# Patient Record
Sex: Female | Born: 1995 | Race: White | Hispanic: No | Marital: Single | Smoking: Never smoker
Health system: Southern US, Community
[De-identification: ages and names within clinical notes are randomized; demographics above are authoritative.]

## PROBLEM LIST (undated history)

## (undated) DIAGNOSIS — K219 Gastro-esophageal reflux disease without esophagitis: Secondary | ICD-10-CM

## (undated) DIAGNOSIS — F32A Depression, unspecified: Secondary | ICD-10-CM

## (undated) DIAGNOSIS — F329 Major depressive disorder, single episode, unspecified: Secondary | ICD-10-CM

## (undated) HISTORY — PX: WISDOM TOOTH EXTRACTION: SHX21

## (undated) HISTORY — DX: Depression, unspecified: F32.A

## (undated) HISTORY — DX: Gastro-esophageal reflux disease without esophagitis: K21.9

## (undated) HISTORY — DX: Major depressive disorder, single episode, unspecified: F32.9

---
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
## 2013-12-31 ENCOUNTER — Ambulatory Visit (INDEPENDENT_AMBULATORY_CARE_PROVIDER_SITE_OTHER): Payer: BC Managed Care – PPO | Admitting: Family Medicine

## 2013-12-31 ENCOUNTER — Encounter: Payer: Self-pay | Admitting: Family Medicine

## 2013-12-31 VITALS — BP 112/71 | HR 87 | Temp 98.4°F | Ht 65.0 in | Wt 145.0 lb

## 2013-12-31 DIAGNOSIS — F418 Other specified anxiety disorders: Secondary | ICD-10-CM | POA: Diagnosis not present

## 2013-12-31 DIAGNOSIS — T63481A Toxic effect of venom of other arthropod, accidental (unintentional), initial encounter: Secondary | ICD-10-CM

## 2013-12-31 DIAGNOSIS — R05 Cough: Secondary | ICD-10-CM | POA: Diagnosis not present

## 2013-12-31 DIAGNOSIS — T63891A Toxic effect of contact with other venomous animals, accidental (unintentional), initial encounter: Secondary | ICD-10-CM

## 2013-12-31 DIAGNOSIS — R058 Other specified cough: Secondary | ICD-10-CM

## 2013-12-31 MED ORDER — BENZONATATE 100 MG PO CAPS: 100.0000 mg | ORAL_CAPSULE | Freq: Two times a day (BID) | ORAL | Status: DC | PRN

## 2013-12-31 MED ORDER — FLUOXETINE HCL 10 MG PO CAPS: ORAL_CAPSULE | ORAL | Status: DC

## 2013-12-31 NOTE — Progress Notes (Signed)
   Subjective:    Patient ID: Kelli Gill, female    DOB: 09-05-1995, 18 y.o.   MRN: [id number]  HPI  [facility] for anxiety and depression.  She had been getting into fight with her mom and Dad.  They took her to the ED for mood. She didn't want to Scype over the computer and so decided to come in here todayTo discuss and address her mood.Marland Kitchen. Has had thoughts of being better off dead but not thought os harming herself.  She feels that she is stressed with work and with home life. She still lives at home with her parents who primarily support her. She has a younger sister who does very well academically in school. She feels that they are treated very differently. She goes to school at [employer] and works 2 jobs. She works part-time at a [employer] station and part-time with [employer]. She feels like her parents oftentimes belittle her make 5 her when she tries to share her feelings and when she gets upset. She also recently broke up with her boyfriend. She's also been very fresh it was school because she's been having a lot of computerproblems. She's been there multiple times to try to get the Internet and problems corrected so that she can complete her assignments. She says to the point that she sent fresh rated she almost wants to stop school even though she is about ready to graduate.  Insect sting on her right forearm right before coming here. She says it's painful enemas feels like her right arm is going numb. She denies any itching. No fevers chills or sweats or lightheadedness.  Had a Severe cold a couple weeks ago. Overall she feels better except she is still continued to have a cough. Reverse chills or sweats.  Review of Systems     Objective:   Physical Exam  Constitutional: She is oriented to person, place, and time. She appears well-developed and well-nourished.  HENT:  Head: Normocephalic and atraumatic.  Right Ear: External ear normal.  Left Ear: External  ear normal.  Nose: Nose normal.  Mouth/Throat: Oropharynx is clear and moist.  TMs and canals are clear.   Eyes: Conjunctivae and EOM are normal. Pupils are equal, round, and reactive to light.  Neck: Neck supple. No thyromegaly present.  Cardiovascular: Normal rate, regular rhythm and normal heart sounds.   Pulmonary/Chest: Effort normal and breath sounds normal. She has no wheezes.  Lymphadenopathy:    She has no cervical adenopathy.  Neurological: She is alert and oriented to person, place, and time.  Skin: Skin is warm and dry.  Psychiatric: She has a normal mood and affect.          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  Discussed options of counseling/therapy and medication. She said she is open to both. Will place referral for therapist. Kelli LabradorWe'll also start her on fluoxetine 10 mg or 5 days and then increase to 29 g. I will see her back in one week to make sure that she's doing well. We did discuss the increased risk of suicide. Also discussed additional side effects of the medication.  Postinfectious cough-gave reassurance. We'll recheck her lungs next week as well.  Insect sting - given 800 mg of ibuprofen and 10 mg of Zyrtec here in the office. We'll also provide a cold compress for her to use her ear to help with swelling and erythema.

## 2013-12-31 NOTE — Patient Instructions (Signed)

## 2014-01-07 ENCOUNTER — Ambulatory Visit: Payer: BC Managed Care – PPO | Admitting: Family Medicine

## 2014-01-07 DIAGNOSIS — Z0289 Encounter for other administrative examinations: Secondary | ICD-10-CM

## 2014-02-07 ENCOUNTER — Ambulatory Visit: Payer: BC Managed Care – PPO | Admitting: Family Medicine

## 2014-02-19 ENCOUNTER — Other Ambulatory Visit: Payer: Self-pay | Admitting: Family Medicine

## 2014-05-03 ENCOUNTER — Other Ambulatory Visit: Payer: Self-pay | Admitting: Family Medicine

## 2014-10-06 LAB — HIV ANTIBODY

## 2014-10-20 LAB — HM PAP SMEAR: HM Pap smear: NEGATIVE

## 2014-10-28 ENCOUNTER — Ambulatory Visit (INDEPENDENT_AMBULATORY_CARE_PROVIDER_SITE_OTHER): Payer: BLUE CROSS/BLUE SHIELD | Admitting: Family Medicine

## 2014-10-28 ENCOUNTER — Encounter: Payer: Self-pay | Admitting: Family Medicine

## 2014-10-28 VITALS — BP 106/65 | HR 82 | Temp 98.4°F | Ht 65.0 in | Wt 168.0 lb

## 2014-10-28 DIAGNOSIS — J019 Acute sinusitis, unspecified: Secondary | ICD-10-CM | POA: Diagnosis not present

## 2014-10-28 MED ORDER — AMOXICILLIN 875 MG PO TABS: 875.0000 mg | ORAL_TABLET | Freq: Two times a day (BID) | ORAL | Status: DC

## 2014-10-28 NOTE — Progress Notes (Signed)
   Subjective:    Patient ID: Kelli Gill, female    DOB: [birth year], 19 y.o.   MRN: [id number]  HPI URI x2wks she has only been taking robintussin, yellow mucus, no fever she is currently pregnant EDD 05/16/2015. Cough is deep and stragling.  Talking has been triggering the cough. Has had alot of congestion.     Review of Systems     Objective:   Physical Exam  Constitutional: She is oriented to person, place, and time. She appears well-developed and well-nourished.  HENT:  Head: Normocephalic and atraumatic.  Right Ear: External ear normal.  Left Ear: External ear normal.  Nose: Nose normal.  Mouth/Throat: Oropharynx is clear and moist.  TMs and canals are clear.   Eyes: Conjunctivae and EOM are normal. Pupils are equal, round, and reactive to light.  Neck: Neck supple. No thyromegaly present.  Cardiovascular: Normal rate, regular rhythm and normal heart sounds.   Pulmonary/Chest: Effort normal and breath sounds normal. She has no wheezes.  Lymphadenopathy:    She has no cervical adenopathy.  Neurological: She is alert and oriented to person, place, and time.  Skin: Skin is warm and dry.  Psychiatric: She has a normal mood and affect.          Assessment & Plan:  Acute sinusitis- will treat with amoxicillin. She's in about her 10th week of pregnancy. Call if not better in one week. Can try maybe pot for nasal congestion as well.

## 2015-02-17 ENCOUNTER — Ambulatory Visit (INDEPENDENT_AMBULATORY_CARE_PROVIDER_SITE_OTHER): Payer: BLUE CROSS/BLUE SHIELD | Admitting: Physician Assistant

## 2015-02-17 ENCOUNTER — Encounter: Payer: Self-pay | Admitting: Physician Assistant

## 2015-02-17 VITALS — BP 107/54 | HR 87 | Temp 98.0°F | Ht 65.0 in | Wt 192.0 lb

## 2015-02-17 DIAGNOSIS — J069 Acute upper respiratory infection, unspecified: Secondary | ICD-10-CM

## 2015-02-17 MED ORDER — AZITHROMYCIN 250 MG PO TABS: ORAL_TABLET | ORAL | Status: DC

## 2015-02-17 NOTE — Progress Notes (Signed)
   Subjective:    Patient ID: Kelli Gill, female    DOB: [birth year], 19 y.o.   MRN: [id number]  HPI  Patient is a 19 year old female who is [redacted] weeks pregnant who presents to the clinic with sore throat and dry cough for the last 2 days. She woke up with a worse sore throat the right this 5 out of 10. She denies any fever, chills, body aches. She does have left ear plan. She has not tried anything to make better. Her husband also has similar symptoms. She denies any sinus pressure.   Both woke up with St. Last night and then this morning. No fever, chills, body aches. Ear pain.  Not tried anything.     Review of Systems  All other systems reviewed and are negative.      Objective:   Physical Exam  Constitutional: She is oriented to person, place, and time. She appears well-developed and well-nourished.  HENT:  Head: Normocephalic and atraumatic.  Right Ear: External ear normal.  Left Ear: External ear normal.  TMs slightly erythematous and appears to have some fluid behind TMs. No blood or pus present.  Oropharynx erythematous with no tonsillar swelling or exudate.  Bilateral nares turbinates are red and swollen.  There is no maxillary or frontal sinus tenderness to palpation.  Eyes: Conjunctivae are normal. Right eye exhibits no discharge. Left eye exhibits no discharge.  Neck: Normal range of motion. Neck supple.  Bilateral anterior cervical adenopathy that is tender to palpation.  Cardiovascular: Normal rate, regular rhythm and normal heart sounds.   Pulmonary/Chest: Effort normal and breath sounds normal.  Lymphadenopathy:    She has cervical adenopathy.  Neurological: She is alert and oriented to person, place, and time.  Psychiatric: She has a normal mood and affect. Her behavior is normal.          Assessment & Plan:  Acute upper respiratory infection-symptoms appear to be consistent with a viral infection. I did not see any signs of bacterial infection. I did  write patient out of work for today and tomorrow. Patient was given a handout of over-the-counter medication she can take for symptom control. Encouraged Robitussin-DM. Encouraged honey for sore throat and cough. I did go ahead and print off antibiotic for Z-Pak since it is a holiday weekend. She was instructed only to take this if symptoms worsen in the next 48-72 hours.

## 2015-02-17 NOTE — Patient Instructions (Signed)
Upper Respiratory Infection, Adult Most upper respiratory infections (URIs) are a viral infection of the air passages leading to the lungs. A URI affects the nose, throat, and upper air passages. The most common type of URI is nasopharyngitis and is typically referred to as "the common cold." URIs run their course and usually go away on their own. Most of the time, a URI does not require medical attention, but sometimes a bacterial infection in the upper airways can follow a viral infection. This is called a secondary infection. Sinus and middle ear infections are common types of secondary upper respiratory infections. Bacterial pneumonia can also complicate a URI. A URI can worsen asthma and chronic obstructive pulmonary disease (COPD). Sometimes, these complications can require emergency medical care and may be life threatening.  CAUSES Almost all URIs are caused by viruses. A virus is a type of germ and can spread from one person to another.  RISKS FACTORS You may be at risk for a URI if:   You smoke.   You have chronic heart or lung disease.  You have a weakened defense (immune) system.   You are very young or very old.   You have nasal allergies or asthma.  You work in crowded or poorly ventilated areas.  You work in health care facilities or schools. SIGNS AND SYMPTOMS  Symptoms typically develop 2-3 days after you come in contact with a cold virus. Most viral URIs last 7-10 days. However, viral URIs from the influenza virus (flu virus) can last 14-18 days and are typically more severe. Symptoms may include:   Runny or stuffy (congested) nose.   Sneezing.   Cough.   Sore throat.   Headache.   Fatigue.   Fever.   Loss of appetite.   Pain in your forehead, behind your eyes, and over your cheekbones (sinus pain).  Muscle aches.  DIAGNOSIS  Your health care provider may diagnose a URI by:  Physical exam.  Tests to check that your symptoms are not due to  another condition such as:  Strep throat.  Sinusitis.  Pneumonia.  Asthma. TREATMENT  A URI goes away on its own with time. It cannot be cured with medicines, but medicines may be prescribed or recommended to relieve symptoms. Medicines may help:  Reduce your fever.  Reduce your cough.  Relieve nasal congestion. HOME CARE INSTRUCTIONS   Take medicines only as directed by your health care provider.   Gargle warm saltwater or take cough drops to comfort your throat as directed by your health care provider.  Use a warm mist humidifier or inhale steam from a shower to increase air moisture. This may make it easier to breathe.  Drink enough fluid to keep your urine clear or pale yellow.   Eat soups and other clear broths and maintain good nutrition.   Rest as needed.   Return to work when your temperature has returned to normal or as your health care provider advises. You may need to stay home longer to avoid infecting others. You can also use a face mask and careful hand washing to prevent spread of the virus.  Increase the usage of your inhaler if you have asthma.   Do not use any tobacco products, including cigarettes, chewing tobacco, or electronic cigarettes. If you need help quitting, ask your health care provider. PREVENTION  The best way to protect yourself from getting a cold is to practice good hygiene.   Avoid oral or hand contact with people with cold   symptoms.   Wash your hands often if contact occurs.  There is no clear evidence that vitamin C, vitamin E, echinacea, or exercise reduces the chance of developing a cold. However, it is always recommended to get plenty of rest, exercise, and practice good nutrition.  SEEK MEDICAL CARE IF:   You are getting worse rather than better.   Your symptoms are not controlled by medicine.   You have chills.  You have worsening shortness of breath.  You have brown or red mucus.  You have yellow or brown nasal  discharge.  You have pain in your face, especially when you bend forward.  You have a fever.  You have swollen neck glands.  You have pain while swallowing.  You have white areas in the back of your throat. SEEK IMMEDIATE MEDICAL CARE IF:   You have severe or persistent:  Headache.  Ear pain.  Sinus pain.  Chest pain.  You have chronic lung disease and any of the following:  Wheezing.  Prolonged cough.  Coughing up blood.  A change in your usual mucus.  You have a stiff neck.  You have changes in your:  Vision.  Hearing.  Thinking.  Mood. MAKE SURE YOU:   Understand these instructions.  Will watch your condition.  Will get help right away if you are not doing well or get worse.   This information is not intended to replace advice given to you by your health care provider. Make sure you discuss any questions you have with your health care provider.   Document Released: 08/07/2000 Document Revised: 06/28/2014 Document Reviewed: 05/19/2013 Elsevier Interactive Patient Education 2016 [employer]  

## 2015-02-21 ENCOUNTER — Telehealth: Payer: Self-pay

## 2015-02-21 ENCOUNTER — Ambulatory Visit (INDEPENDENT_AMBULATORY_CARE_PROVIDER_SITE_OTHER): Payer: BLUE CROSS/BLUE SHIELD | Admitting: Physician Assistant

## 2015-02-21 ENCOUNTER — Encounter: Payer: Self-pay | Admitting: Physician Assistant

## 2015-02-21 VITALS — BP 106/66 | HR 108 | Ht 65.0 in | Wt 189.0 lb

## 2015-02-21 DIAGNOSIS — J069 Acute upper respiratory infection, unspecified: Secondary | ICD-10-CM | POA: Diagnosis not present

## 2015-02-21 MED ORDER — AZITHROMYCIN 250 MG PO TABS: ORAL_TABLET | ORAL | Status: DC

## 2015-02-21 NOTE — Patient Instructions (Signed)
robatussin DM.

## 2015-02-21 NOTE — Telephone Encounter (Signed)
Left message advising patient to use the rx that was printed for her on Friday and have that filled.

## 2015-02-22 NOTE — Progress Notes (Signed)
   Subjective:    Patient ID: Kelli Gill, female    DOB: October 23, 1995, 19 y.o.   MRN: [id number]  HPI Pt is a 19 yo female, [redacted] weeks pregnant, who presents to the clinic with URI symptoms that have worsened. She was seen on 02/17/15 and encouraged to try safe OTC medications. She started a nasal saline, vitamin C, zinc, and sudafed combination. Seems to be getting worse. Work made her leave today. She never started abx given on Friday to use if symptoms worsened. No fever, SOB, wheezing. She does have a cough.    Review of Systems  All other systems reviewed and are negative.      Objective:   Physical Exam  Constitutional: She is oriented to person, place, and time. She appears well-developed and well-nourished.  HENT:  Head: Normocephalic and atraumatic.  Right Ear: External ear normal.  Left Ear: External ear normal.  Mouth/Throat: No oropharyngeal exudate.  TM's dull with light reflex absent over right ear.  Tenderness over maxillary sinuses to palpation.  Bilateral nasal turbinates red and swollen.   Eyes:  Bilateral conjunctiva injected.   Neck: Normal range of motion. Neck supple.  Cardiovascular: Normal rate, regular rhythm and normal heart sounds.   Pulmonary/Chest: Effort normal and breath sounds normal. She has no wheezes.  Lymphadenopathy:    She has cervical adenopathy.  Neurological: She is alert and oriented to person, place, and time.  Psychiatric: She has a normal mood and affect. Her behavior is normal.          Assessment & Plan:  Acute upper respiratory infection- start zpak today. Written out of work for today and tomorrow. Rest and hydrate. Try robatussin DM. Follow up as needed.

## 2015-02-24 LAB — HIV ANTIBODY: HIV: NEGATIVE

## 2015-04-17 LAB — CHLAMYDIA SCREEN: Chlamydia, Swab/Urine, PCR: NEGATIVE

## 2015-04-17 LAB — GONORRHEA SCREEN: GC Probe Amp, Genital: NEGATIVE

## 2015-04-27 ENCOUNTER — Encounter: Payer: Self-pay | Admitting: Family Medicine

## 2015-05-12 ENCOUNTER — Ambulatory Visit: Payer: BC Managed Care – PPO | Admitting: Family Medicine

## 2015-06-28 ENCOUNTER — Encounter: Payer: Self-pay | Admitting: *Deleted

## 2015-06-28 ENCOUNTER — Emergency Department (INDEPENDENT_AMBULATORY_CARE_PROVIDER_SITE_OTHER)
Admission: EM | Admit: 2015-06-28 | Discharge: 2015-06-28 | Disposition: A | Discharge status: Home / Self Care | Payer: Medicaid Other | Source: Home / Self Care | Attending: Family Medicine | Admitting: Family Medicine

## 2015-06-28 DIAGNOSIS — L03115 Cellulitis of right lower limb: Secondary | ICD-10-CM | POA: Diagnosis not present

## 2015-06-28 MED ORDER — DOXYCYCLINE HYCLATE 100 MG PO CAPS
100.0000 mg | ORAL_CAPSULE | Freq: Two times a day (BID) | ORAL | Status: DC
Start: 2015-06-28 — End: 2015-11-11

## 2015-06-28 NOTE — ED Provider Notes (Signed)
CSN: [id number]     Arrival date & time 06/28/15  1753 History   First MD Initiated Contact with Patient 06/28/15 1813     Chief Complaint  Patient presents with  . Abscess      HPI Comments: Patient complains of a sore area on her right leg that started yesterday, and gradually increased in size.  She recalls no injury or insect bite.  Patient is a 20 y.o. female presenting with rash. The history is provided by the patient.  Rash Location:  Leg Leg rash location:  R lower leg Quality: dryness, painful, redness and swelling   Quality: not blistering, not bruising, not draining, not itchy, not peeling and not weeping   Pain details:    Quality:  Aching   Severity:  Mild   Onset quality:  Sudden   Duration:  1 day   Timing:  Constant   Progression:  Worsening Severity:  Mild Onset quality:  Sudden Duration:  1 day Timing:  Constant Progression:  Worsening Chronicity:  New Context: not animal contact, not chemical exposure, not exposure to similar rash, not hot tub use, not insect bite/sting and not plant contact   Relieved by:  Nothing Worsened by:  Contact Ineffective treatments:  None tried Associated symptoms: induration   Associated symptoms: no abdominal pain, no fatigue, no fever, no headaches, no joint pain, no myalgias and no nausea     History reviewed. No pertinent past medical history. History reviewed. No pertinent past surgical history. Family History  Problem Relation Age of Onset  . Diabetes Mother   . Hypertension Father    Social History  Substance Use Topics  . Smoking status: Never Smoker   . Smokeless tobacco: None  . Alcohol Use: No   OB History    Gravida Para Term Preterm AB TAB SAB Ectopic Multiple Living   1              Review of Systems  Constitutional: Negative for fever and fatigue.  Gastrointestinal: Negative for nausea and abdominal pain.  Musculoskeletal: Negative for myalgias and arthralgias.  Skin: Positive for rash.    Neurological: Negative for headaches.  All other systems reviewed and are negative.   Allergies  Review of patient's allergies indicates no known allergies.  Home Medications   Prior to Admission medications   Medication Sig Start Date End Date Taking? Authorizing Provider  escitalopram (LEXAPRO) 20 MG tablet Take 20 mg by mouth daily.   Yes Historical Provider, MD  doxycycline (VIBRAMYCIN) 100 MG capsule Take 1 capsule (100 mg total) by mouth 2 (two) times daily. Take with food. 06/28/15   Lattie Haw, MD   Meds Ordered and Administered this Visit  Medications - No data to display  BP 102/67 mmHg  Pulse 64  Temp(Src) 98.6 F (37 C) (Oral)  Resp 16  Ht  (1.651 m)  Wt 191 lb (86.637 kg)  BMI 31.78 kg/m2  SpO2 96%  LMP 06/28/2015  Breastfeeding? Unknown No data found.   Physical Exam  Constitutional: She is oriented to person, place, and time. She appears well-developed and well-nourished. No distress.  HENT:  Head: Normocephalic.  Mouth/Throat: Oropharynx is clear and moist.  Eyes: Pupils are equal, round, and reactive to light.  Neck: Neck supple.  Cardiovascular: Normal heart sounds.   Pulmonary/Chest: Breath sounds normal.  Musculoskeletal:       Legs: Right lower leg pre-tibial area has a 5cm diameter nummular erythematous indurated area with tenderness to  palpation but no fluctuance.  Lymphadenopathy:    She has no cervical adenopathy.  Neurological: She is alert and oriented to person, place, and time.  Skin: Skin is warm and dry.  Nursing note and vitals reviewed.   ED Course  Procedures none  MDM   1. Cellulitis of leg, right    Begin doxycycline 100mg  BID for staph coverage. Apply heating pad 3 or 4 times daily.  May take Ibuprofen 200mg , 3 or 4 tabs every 8 hours with food.  If symptoms become significantly worse during the night or over the weekend, proceed to the local emergency room.  Followup with Family Doctor if not improved in about  5 days.    Lattie HawStephen A Beese, MD 07/04/15 2216

## 2015-06-28 NOTE — Discharge Instructions (Signed)
Apply heating pad 3 or 4 times daily.  May take Ibuprofen 200mg , 3 or 4 tabs every 8 hours with food.  If symptoms become significantly worse during the night or over the weekend, proceed to the local emergency room.    Cellulitis Cellulitis is an infection of the skin and the tissue beneath it. The infected area is usually red and tender. Cellulitis occurs most often in the arms and lower legs.  CAUSES  Cellulitis is caused by bacteria that enter the skin through cracks or cuts in the skin. The most common types of bacteria that cause cellulitis are staphylococci and streptococci. SIGNS AND SYMPTOMS   Redness and warmth.  Swelling.  Tenderness or pain.  Fever. DIAGNOSIS  Your health care provider can usually determine what is wrong based on a physical exam. Blood tests may also be done. TREATMENT  Treatment usually involves taking an antibiotic medicine. HOME CARE INSTRUCTIONS   Take your antibiotic medicine as directed by your health care provider. Finish the antibiotic even if you start to feel better.  Keep the infected arm or leg elevated to reduce swelling.  Apply a warm cloth to the affected area up to 4 times per day to relieve pain.  Take medicines only as directed by your health care provider.  Keep all follow-up visits as directed by your health care provider. SEEK MEDICAL CARE IF:   You notice red streaks coming from the infected area.  Your red area gets larger or turns dark in color.  Your bone or joint underneath the infected area becomes painful after the skin has healed.  Your infection returns in the same area or another area.  You notice a swollen bump in the infected area.  You develop new symptoms.  You have a fever. SEEK IMMEDIATE MEDICAL CARE IF:   You feel very sleepy.  You develop vomiting or diarrhea.  You have a general ill feeling (malaise) with muscle aches and pains.   This information is not intended to replace advice given to you  by your health care provider. Make sure you discuss any questions you have with your health care provider.   Document Released: 11/21/2004 Document Revised: 11/02/2014 Document Reviewed: 04/29/2011 Elsevier Interactive Patient Education [employer]2016 [employer] Inc[employer]

## 2015-06-28 NOTE — ED Notes (Signed)
Pt c/o abscess to her RLL x 1 day. Denies fever.

## 2015-07-03 DIAGNOSIS — Z6832 Body mass index (BMI) 32.0-32.9, adult: Secondary | ICD-10-CM | POA: Diagnosis not present

## 2015-11-11 ENCOUNTER — Encounter: Payer: Self-pay | Admitting: Emergency Medicine

## 2015-11-11 ENCOUNTER — Emergency Department (INDEPENDENT_AMBULATORY_CARE_PROVIDER_SITE_OTHER)
Admission: EM | Admit: 2015-11-11 | Discharge: 2015-11-11 | Disposition: A | Discharge status: Home / Self Care | Payer: BLUE CROSS/BLUE SHIELD | Source: Home / Self Care | Attending: Family Medicine | Admitting: Family Medicine

## 2015-11-11 DIAGNOSIS — J069 Acute upper respiratory infection, unspecified: Secondary | ICD-10-CM

## 2015-11-11 DIAGNOSIS — M940 Chondrocostal junction syndrome [Tietze]: Secondary | ICD-10-CM

## 2015-11-11 DIAGNOSIS — B9789 Other viral agents as the cause of diseases classified elsewhere: Principal | ICD-10-CM

## 2015-11-11 MED ORDER — AZITHROMYCIN 250 MG PO TABS: ORAL_TABLET | ORAL | 0 refills | Status: DC

## 2015-11-11 MED ORDER — BENZONATATE 200 MG PO CAPS: 200.0000 mg | ORAL_CAPSULE | Freq: Every day | ORAL | 0 refills | Status: DC

## 2015-11-11 NOTE — ED Provider Notes (Signed)
[facility]    CSN: [id number] Arrival date & time: 11/11/15  1030  First Provider Contact:  First MD Initiated Contact with Patient 11/11/15 1048        History   Chief Complaint Chief Complaint  Patient presents with  . URI    HPI Kelli Gill is a 20 y.o. female.   Patient complains of two day history of typical cold-like symptoms, including mild sore throat, sinus congestion, fatigue, and cough.    The history is provided by the patient.    History reviewed. No pertinent past medical history.  Patient Active Problem List   Diagnosis Date Noted  . Eczematous dermatitis 09/07/2012  . Plantar fasciitis 08/11/2012  . ABDOMINAL PAIN 11/23/2009  . 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 No pertinent surgical history.  OB History    Gravida Para Term Preterm AB Living   1             SAB TAB Ectopic Multiple Live Births                   Home Medications    Prior to Admission medications   Medication Sig Start Date End Date Taking? Authorizing Provider  azithromycin (ZITHROMAX Z-PAK) 250 MG tablet Take 2 tabs today; then begin one tab once daily for 4 more days. (Rx void after 11/19/15) 11/11/15   Lattie Haw, MD  benzonatate (TESSALON) 200 MG capsule Take 1 capsule (200 mg total) by mouth at bedtime. Take as needed for cough 11/11/15   Lattie Haw, MD    Family History Family History  Problem Relation Age of Onset  . Diabetes Mother   . Hypertension Father     Social History Social History  Substance Use Topics  . Smoking status: Never Smoker  . Smokeless tobacco: Never Used  . Alcohol use No     Allergies   Review of patient's allergies indicates no known allergies.   Review of Systems Review of Systems + sore throat + cough No pleuritic pain, but has tightness in anterior chest. No wheezing + nasal congestion + post-nasal drainage No sinus pain/pressure No itchy/red eyes + left earache No hemoptysis No  SOB No fever/chills No nausea No vomiting No abdominal pain No diarrhea No urinary symptoms No skin rash + fatigue No myalgias No headache Used OTC meds without relief   Physical Exam Triage Vital Signs ED Triage Vitals  Enc Vitals Group     BP 11/11/15 1032 116/71     Pulse Rate 11/11/15 1032 94     Resp --      Temp 11/11/15 1032 98.4 F (36.9 C)     Temp Source 11/11/15 1032 Oral     SpO2 11/11/15 1032 96 %     Weight 11/11/15 1032 193 lb 8 oz (87.8 kg)     Height 11/11/15 1032 5\' 5"  (1.651 m)     Head Circumference --      Peak Flow --      Pain Score 11/11/15 1034 7     Pain Loc --      Pain Edu? --      Excl. in GC? --    No data found.   Updated Vital Signs BP 116/71 (BP Location: Left Arm)   Pulse 94   Temp 98.4 F (36.9 C) (Oral)   Ht 5\' 5"  (1.651 m)   Wt 193 lb 8 oz (87.8 kg)   SpO2 96%   BMI  32.20 kg/m   Visual Acuity Right Eye Distance:   Left Eye Distance:   Bilateral Distance:    Right Eye Near:   Left Eye Near:    Bilateral Near:     Physical Exam Nursing notes and Vital Signs reviewed. Appearance:  Patient appears stated age, and in no acute distress Eyes:  Pupils are equal, round, and reactive to light and accomodation.  Extraocular movement is intact.  Conjunctivae are not inflamed  Ears:  Canals normal.  Tympanic membranes normal.  Nose:  Mildly congested turbinates.  No sinus tenderness.      Pharynx:  Normal Neck:  Supple.  Tender enlarged posterior/lateral nodes are palpated bilaterally  Lungs:  Clear to auscultation.  Breath sounds are equal.  Moving air well. Chest:  Distinct tenderness to palpation over the mid-sternum.  Heart:  Regular rate and rhythm without murmurs, rubs, or gallops.  Abdomen:  Nontender without masses or hepatosplenomegaly.  Bowel sounds are present.  No CVA or flank tenderness.  Extremities:  No edema.  Skin:  No rash present.    UC Treatments / Results  Labs (all labs ordered are listed, but only  abnormal results are displayed) Labs Reviewed - No data to display  EKG  EKG Interpretation None       Radiology No results found.  Procedures Procedures (including critical care time)  Medications Ordered in UC Medications - No data to display   Initial Impression / Assessment and Plan / UC Course  I have reviewed the triage vital signs and the nursing notes.  Pertinent labs & imaging results that were available during my care of the patient were reviewed by me and considered in my medical decision making (see chart for details).  Clinical Course  There is no evidence of bacterial infection today.   Prescription written for Benzonatate [facility](Tessalon) to take at bedtime for night-time cough.  Take plain guaifenesin (1200mg  extended release tabs such as Mucinex) twice daily, with plenty of water, for cough and congestion.  May add Pseudoephedrine (30mg , one or two every 4 to 6 hours) for sinus congestion.  Get adequate rest.   May use Afrin nasal spray (or generic oxymetazoline) twice daily for about 5 days and then discontinue.  Also recommend using saline nasal spray several times daily and saline nasal irrigation (AYR is a common brand).  Use Flonase nasal spray each morning after using Afrin nasal spray and saline nasal irrigation. Try warm salt water gargles for sore throat.  Stop all antihistamines for now, and other non-prescription cough/cold preparations. May take Ibuprofen 200mg , 4 tabs every 8 hours with food for chest/sternum discomfort. Begin Azithromycin if not improving about one week or if persistent fever develops (Given a prescription to hold, with an expiration date)  Follow-up with family doctor if not improving about10 days.      Final Clinical Impressions(s) / UC Diagnoses   Final diagnoses:  Viral URI with cough  Costochondritis    New Prescriptions New Prescriptions   AZITHROMYCIN (ZITHROMAX Z-PAK) 250 MG TABLET    Take 2 tabs today; then begin one  tab once daily for 4 more days. (Rx void after 11/19/15)   BENZONATATE (TESSALON) 200 MG CAPSULE    Take 1 capsule (200 mg total) by mouth at bedtime. Take as needed for cough     Lattie HawStephen A Beese, MD 11/22/15 2208

## 2015-11-11 NOTE — ED Triage Notes (Signed)
Pt c/o left sided facial pressure, productive cough, chest discomfort, runny nose x 3 days, no fever.

## 2015-11-11 NOTE — Discharge Instructions (Signed)
Take plain guaifenesin (1200mg  extended release tabs such as Mucinex) twice daily, with plenty of water, for cough and congestion.  May add Pseudoephedrine (30mg , one or two every 4 to 6 hours) for sinus congestion.  Get adequate rest.   May use Afrin nasal spray (or generic oxymetazoline) twice daily for about 5 days and then discontinue.  Also recommend using saline nasal spray several times daily and saline nasal irrigation (AYR is a common brand).  Use Flonase nasal spray each morning after using Afrin nasal spray and saline nasal irrigation. Try warm salt water gargles for sore throat.  Stop all antihistamines for now, and other non-prescription cough/cold preparations. May take Ibuprofen 200mg , 4 tabs every 8 hours with food for chest/sternum discomfort. Begin Azithromycin if not improving about one week or if persistent fever develops (Given a prescription to hold, with an expiration date)  Follow-up with family doctor if not improving about10 days.

## 2015-12-18 ENCOUNTER — Encounter: Payer: Self-pay | Admitting: Sports Medicine

## 2015-12-18 ENCOUNTER — Ambulatory Visit (INDEPENDENT_AMBULATORY_CARE_PROVIDER_SITE_OTHER): Payer: BLUE CROSS/BLUE SHIELD | Admitting: Sports Medicine

## 2015-12-18 ENCOUNTER — Ambulatory Visit (INDEPENDENT_AMBULATORY_CARE_PROVIDER_SITE_OTHER): Payer: BLUE CROSS/BLUE SHIELD

## 2015-12-18 DIAGNOSIS — M79672 Pain in left foot: Secondary | ICD-10-CM

## 2015-12-18 DIAGNOSIS — L600 Ingrowing nail: Secondary | ICD-10-CM

## 2015-12-18 IMAGING — DX DG FOOT COMPLETE 3+V*L*
3 series · 3 of 3 positions shown · non-contrast
Comparison: None.

CLINICAL DATA: Left foot pain, initial encounter

EXAM:
LEFT FOOT - COMPLETE 3+ VIEW

[foot ap]
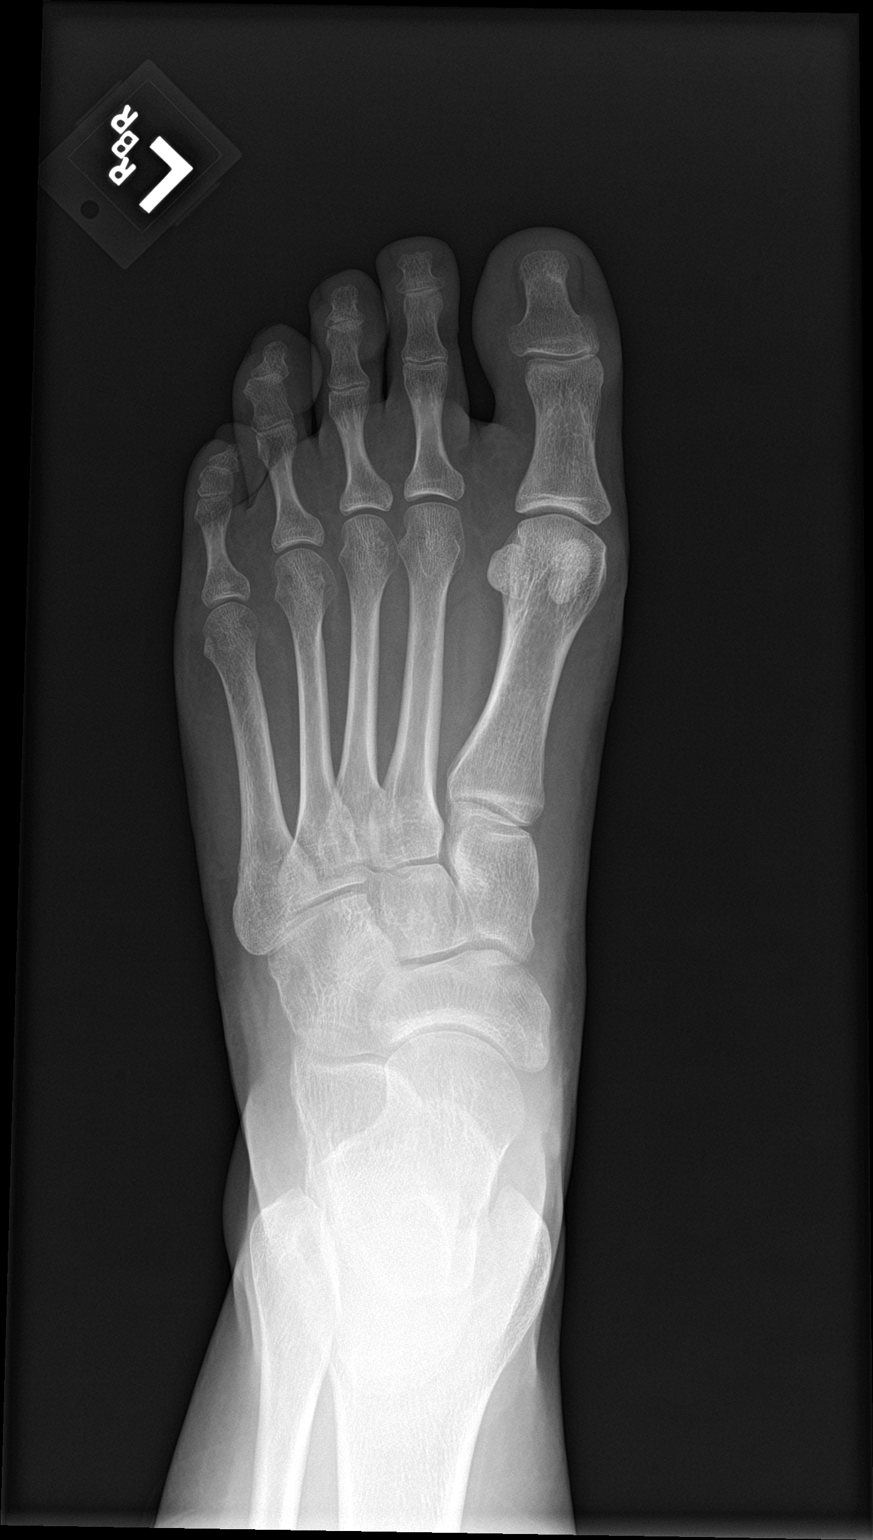

[foot obl]
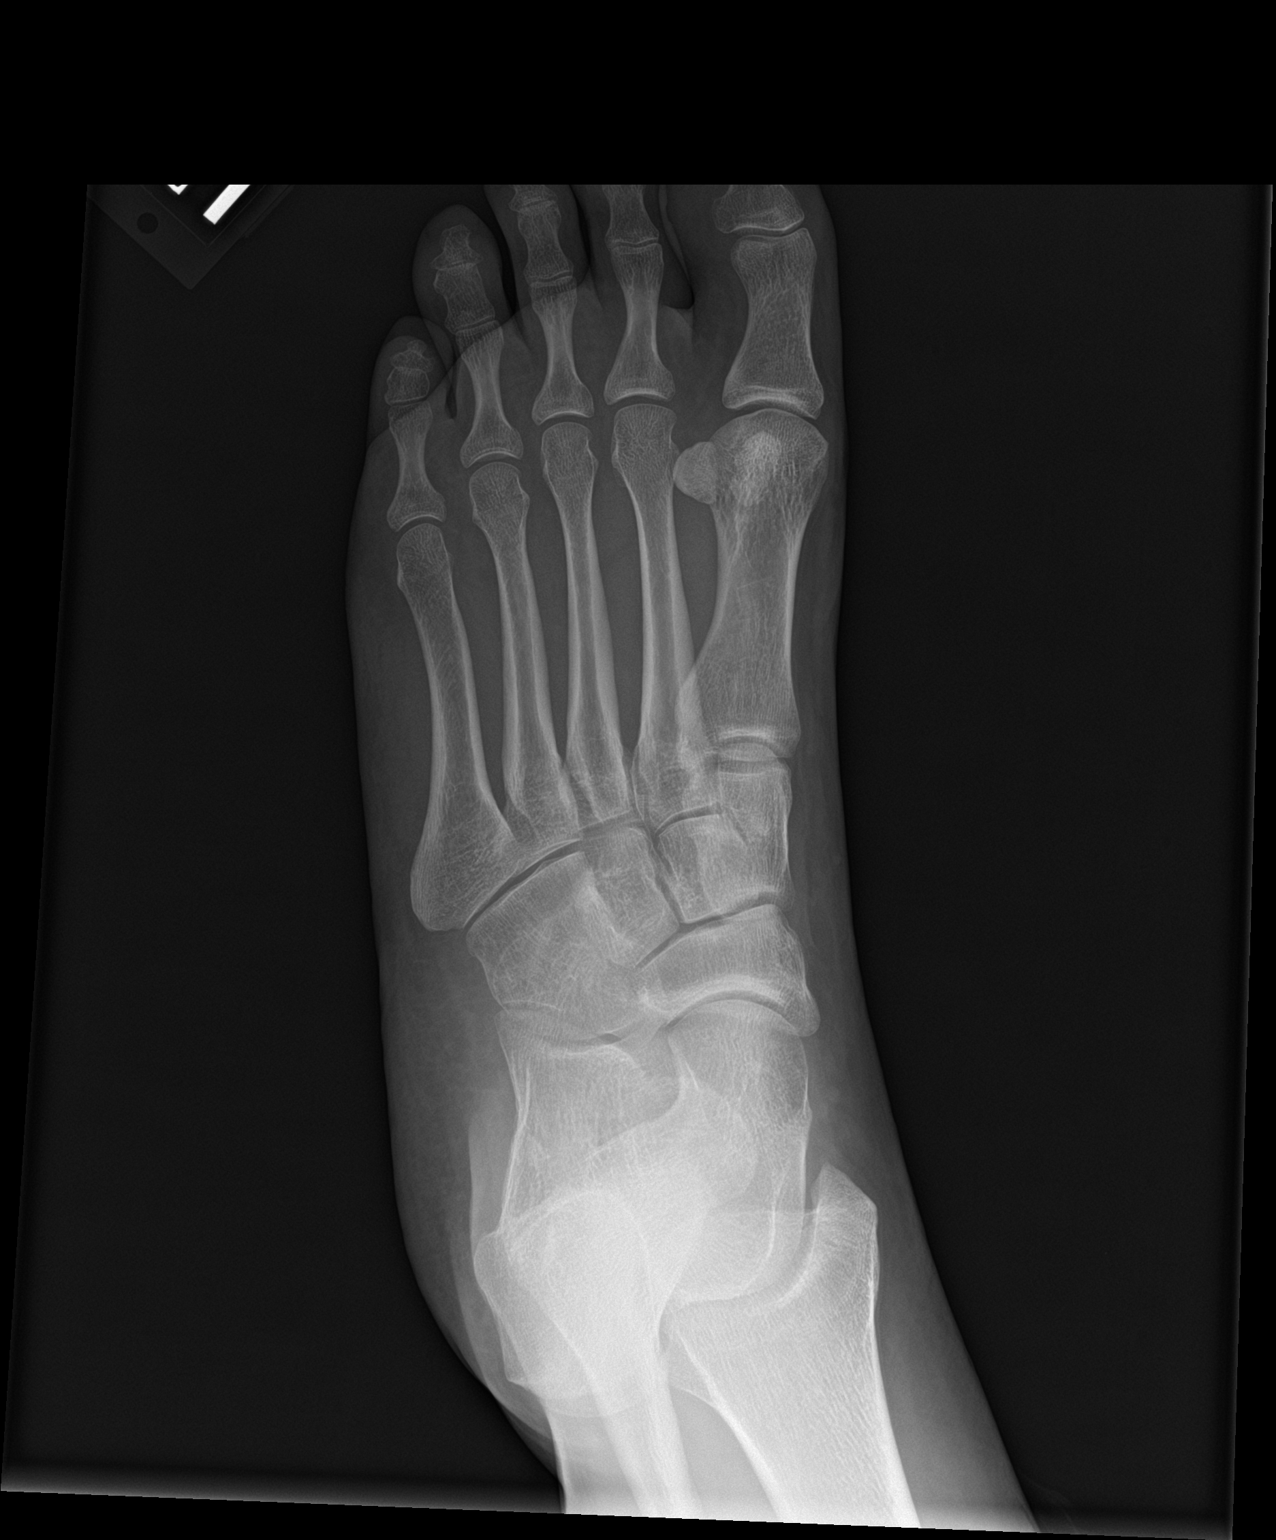

[foot lat]
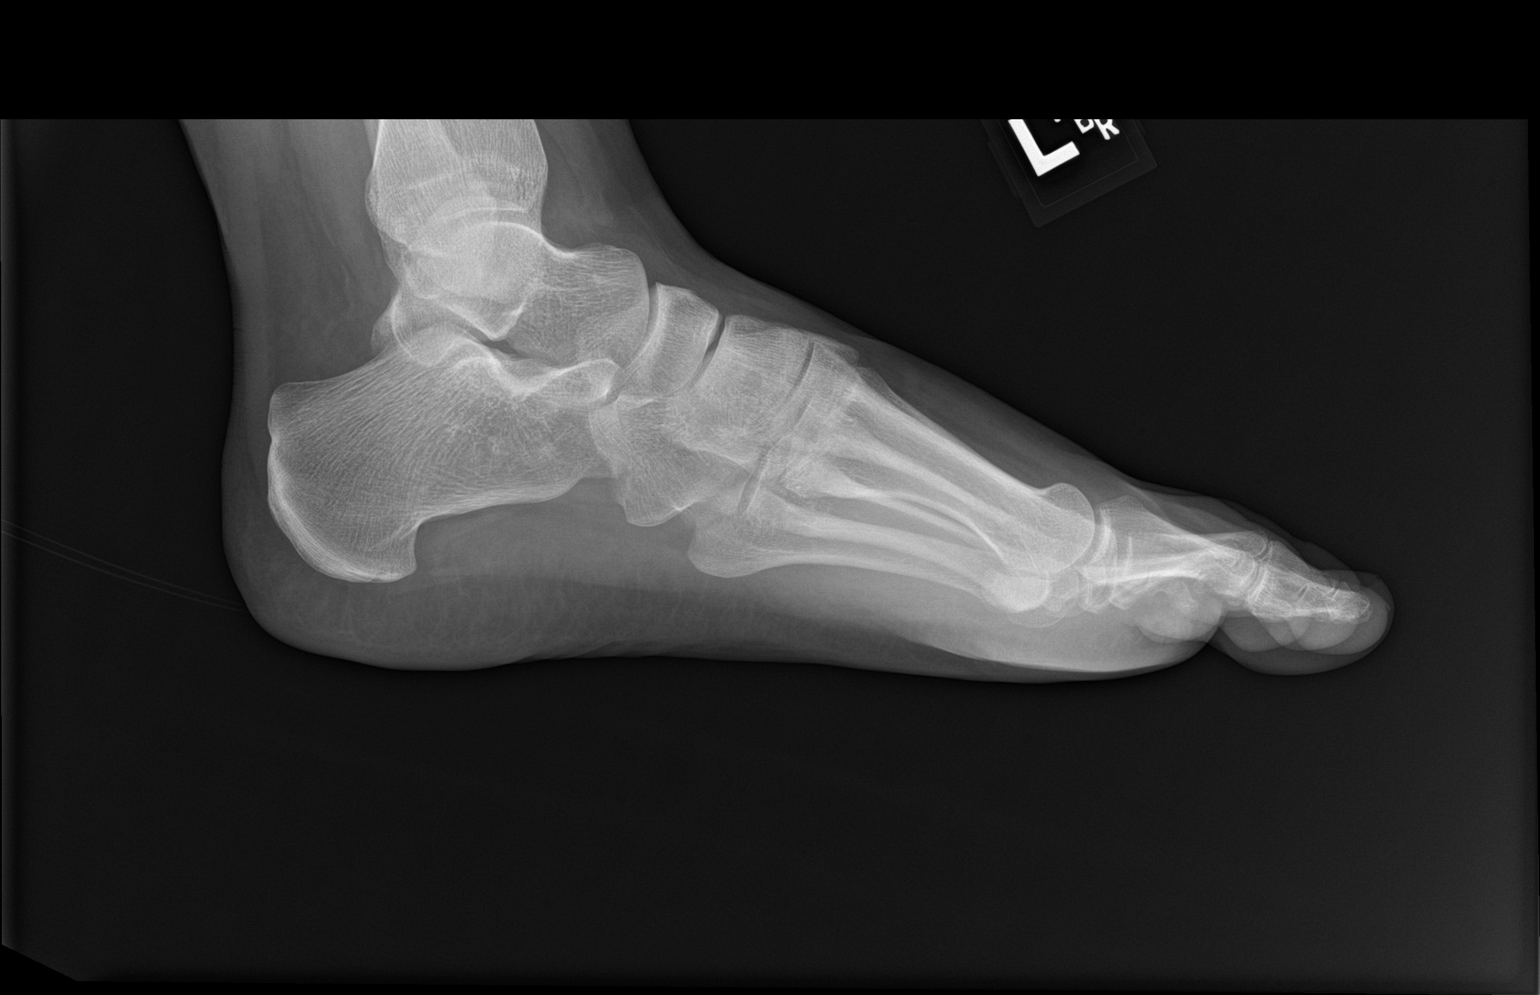

[3 of 3 positions shown; findings below may reference images not displayed]

FINDINGS: There is no evidence of fracture or dislocation. There is no
evidence of arthropathy or other focal bone abnormality. Soft
tissues are unremarkable.
IMPRESSION: No acute abnormality noted.

## 2015-12-18 MED ORDER — DOXYCYCLINE HYCLATE 100 MG PO TABS: 100.0000 mg | ORAL_TABLET | Freq: Two times a day (BID) | ORAL | 0 refills | Status: DC

## 2015-12-18 NOTE — Assessment & Plan Note (Signed)
Left and right lateral nail plate ingrown toenails. Doxycycline for one week then she will return for left and right lateral nail plate excision of the great toenails. She is not breast-feeding so doxycycline will be safe.

## 2015-12-18 NOTE — Assessment & Plan Note (Signed)
Pain over the fourth and fifth metatarsal shaft dorsally suggestive of stress injury. Postop shoe, x-rays, return to see me in one month for this.

## 2015-12-18 NOTE — Progress Notes (Signed)
   Subjective:    I'm seeing this patient as a consultation for:  Dr. Nani Gasseratherine Metheney  CC: Left foot pain  HPI:  This is a pleasant 20 year old female, she comes in with a several month history of increasing pain over the dorsum of her left foot, specifically over the fourth and fifth metatarsal shafts. No change in her activity level, pain is moderate, persistent. No radiation. Mild swelling.  Toe pain: She has ingrown toenails at the lateral nail plate of both great toenails, desires excision at some point.  Past medical history:  Negative.  See flowsheet/record as well for more information.  Surgical history: Negative.  See flowsheet/record as well for more information.  Family history: Negative.  See flowsheet/record as well for more information.  Social history: Negative.  See flowsheet/record as well for more information.  Allergies, and medications have been entered into the medical record, reviewed, and no changes needed.   Review of Systems: No headache, visual changes, nausea, vomiting, diarrhea, constipation, dizziness, abdominal pain, skin rash, fevers, chills, night sweats, weight loss, swollen lymph nodes, body aches, joint swelling, muscle aches, chest pain, shortness of breath, mood changes, visual or auditory hallucinations.   Objective:   General: Well Developed, well nourished, and in no acute distress.  Neuro/Psych: Alert and oriented x3, extra-ocular muscles intact, able to move all 4 extremities, sensation grossly intact. Skin: Warm and dry, no rashes noted.  Respiratory: Not using accessory muscles, speaking in full sentences, trachea midline.  Cardiovascular: Pulses palpable, no extremity edema. Abdomen: Does not appear distended. Bilateral feet: Slight erythema at the lateral nail fold of the great toenails on both sides. Range of motion is full in all directions. Strength is 5/5 in all directions. No hallux valgus. No pes cavus or pes planus. No abnormal  callus noted. No pain over the navicular prominence, or base of fifth metatarsal. No tenderness to palpation of the calcaneal insertion of plantar fascia. No pain at the Achilles insertion. No pain over the calcaneal bursa. No pain of the retrocalcaneal bursa. On the left foot there is tenderness to palpation over the dorsum of the fourth and fifth metatarsal shafts with minimal swelling No hallux rigidus or limitus. No tenderness palpation over interphalangeal joints. No pain with compression of the metatarsal heads. Neurovascularly intact distally.  Left foot x-rays reviewed personally and are unremarkable.  Impression and Recommendations:   This case required medical decision making of moderate complexity.  Ingrown toenail Left and right lateral nail plate ingrown toenails. Doxycycline for one week then she will return for left and right lateral nail plate excision of the great toenails. She is not breast-feeding so doxycycline will be safe.  Left foot pain Pain over the fourth and fifth metatarsal shaft dorsally suggestive of stress injury. Postop shoe, x-rays, return to see me in one month for this.

## 2015-12-25 ENCOUNTER — Encounter: Payer: Self-pay | Admitting: Sports Medicine

## 2015-12-25 ENCOUNTER — Ambulatory Visit (INDEPENDENT_AMBULATORY_CARE_PROVIDER_SITE_OTHER): Payer: BLUE CROSS/BLUE SHIELD | Admitting: Sports Medicine

## 2015-12-25 DIAGNOSIS — L600 Ingrowing nail: Secondary | ICD-10-CM

## 2015-12-25 MED ORDER — HYDROCODONE-ACETAMINOPHEN 5-325 MG PO TABS: 1.0000 | ORAL_TABLET | Freq: Three times a day (TID) | ORAL | 0 refills | Status: DC | PRN

## 2015-12-25 NOTE — Assessment & Plan Note (Signed)
Right and left lateral nail plate excision with phenol. Hydrocodone for pain.  Return in 2 weeks for a wound check.

## 2015-12-25 NOTE — Progress Notes (Signed)
  Procedure:  Removal of left great lateral toenail. Risks, benefits, alternatives explained to patient. Consent obtained. Time out conducted. Noted no overlying induration or erythema at site of injection. Toe cleaned with alcohol, then a total of 4cc lidocaine 2% infiltrated at adjacent webspaces at the location of the bifurcation of the common digital nerve to proper digital nerves.  Some lidocaine also infiltrated at hyponychium and under nail bed.  Adequate anesthesia ensured. Toe prepped and draped in a sterile fashion. Nail elevator used to separate nail plate from nail bed. Clippers used to cut toenail in a longitudinal fashion to proximal nail fold and matrix. Hemostat then used to separate nail fragment from surrounding structures. Nail bed and matrix treated. Minor bleeding controlled with pressure and phenol. Antibiotic ointment applied. Toe dressed. Advised to return if increased redness, swelling, drainage, fevers, or chills.  Procedure:  Removal of right great lateral toenail. Risks, benefits, alternatives explained to patient. Consent obtained. Time out conducted. Noted no overlying induration or erythema at site of injection. Toe cleaned with alcohol, then a total of 4cc lidocaine 2% infiltrated at adjacent webspaces at the location of the bifurcation of the common digital nerve to proper digital nerves.  Some lidocaine also infiltrated at hyponychium and under nail bed.  Adequate anesthesia ensured. Toe prepped and draped in a sterile fashion. Nail elevator used to separate nail plate from nail bed. Clippers used to cut toenail in a longitudinal fashion to proximal nail fold and matrix. Hemostat then used to separate nail fragment from surrounding structures. Nail bed and matrix treated. Minor bleeding controlled with pressure and phenol. Antibiotic ointment applied. Toe dressed. Advised to return if increased redness, swelling, drainage, fevers, or chills.

## 2015-12-27 ENCOUNTER — Telehealth: Payer: Self-pay | Admitting: Sports Medicine

## 2015-12-27 NOTE — Telephone Encounter (Signed)
Pt called clinic requesting letter to be out of work today (12/27/15). Pt plans to return to work on 12/28/15. Pt reports she is still having some pain. Pt will pick up letter when ready. Will route.

## 2015-12-28 ENCOUNTER — Encounter: Payer: Self-pay | Admitting: Sports Medicine

## 2015-12-28 NOTE — Telephone Encounter (Signed)
Letter at front

## 2016-01-01 ENCOUNTER — Ambulatory Visit (INDEPENDENT_AMBULATORY_CARE_PROVIDER_SITE_OTHER): Payer: BLUE CROSS/BLUE SHIELD | Admitting: Sports Medicine

## 2016-01-01 DIAGNOSIS — L600 Ingrowing nail: Secondary | ICD-10-CM

## 2016-01-01 MED ORDER — HYDROCODONE-ACETAMINOPHEN 10-325 MG PO TABS: 1.0000 | ORAL_TABLET | Freq: Three times a day (TID) | ORAL | 0 refills | Status: DC | PRN

## 2016-01-01 NOTE — Progress Notes (Signed)
  Subjective: 1 week post left and right great toe lateral nail plate excision, doing well, still with some pain. Concerned about drainage.   Objective: General: Well-developed, well-nourished, and in no acute distress. Toes were examined, there is no sign of bacterial superinfection, there is granulation tissue on the left toe, serous drainage but nothing purulent.  Assessment/plan:   Ingrown toenail 1 week post left and right lateral nail plate excision. Overall doing well but having some pain. Increasing hydrocodone to 10 mg, out of work from Friday until Wednesday. Return to see me in 2 more weeks.

## 2016-01-01 NOTE — Assessment & Plan Note (Signed)
1 week post left and right lateral nail plate excision. Overall doing well but having some pain. Increasing hydrocodone to 10 mg, out of work from Friday until Wednesday. Return to see me in 2 more weeks.

## 2016-01-08 ENCOUNTER — Ambulatory Visit: Payer: BLUE CROSS/BLUE SHIELD | Admitting: Sports Medicine

## 2016-01-15 ENCOUNTER — Ambulatory Visit (INDEPENDENT_AMBULATORY_CARE_PROVIDER_SITE_OTHER): Payer: BLUE CROSS/BLUE SHIELD | Admitting: Sports Medicine

## 2016-01-15 DIAGNOSIS — E669 Obesity, unspecified: Secondary | ICD-10-CM

## 2016-01-15 DIAGNOSIS — L600 Ingrowing nail: Secondary | ICD-10-CM

## 2016-01-15 MED ORDER — PHENTERMINE HCL 37.5 MG PO TABS: ORAL_TABLET | ORAL | 0 refills | Status: DC

## 2016-01-15 NOTE — Assessment & Plan Note (Signed)
Starting phentermine, return in one month for weight check and refills. Referral to nutrition. She is unsure which antidepressants she is on, however the SSRIs are typically weight positive, we can consider switching her to Wellbutrin. Checking hemoglobin A1c, CBC, CMP, TSH.

## 2016-01-15 NOTE — Assessment & Plan Note (Signed)
Essentially completely healed at this point.

## 2016-01-15 NOTE — Progress Notes (Signed)
  Subjective:    CC: Follow-up  HPI: Toenail removal: 3 weeks post lateral nail plate excision is of both great toenails, doing well now.  Obesity: Would like help losing weight.  Past medical history:  Negative.  See flowsheet/record as well for more information.  Surgical history: Negative.  See flowsheet/record as well for more information.  Family history: Negative.  See flowsheet/record as well for more information.  Social history: Negative.  See flowsheet/record as well for more information.  Allergies, and medications have been entered into the medical record, reviewed, and no changes needed.   Review of Systems: No fevers, chills, night sweats, weight loss, chest pain, or shortness of breath.   Objective:    General: Well Developed, well nourished, and in no acute distress.  Neuro: Alert and oriented x3, extra-ocular muscles intact, sensation grossly intact.  HEENT: Normocephalic, atraumatic, pupils equal round reactive to light, neck supple, no masses, no lymphadenopathy, thyroid nonpalpable.  Skin: Warm and dry, no rashes. Cardiac: Regular rate and rhythm, no murmurs rubs or gallops, no lower extremity edema.  Respiratory: Clear to auscultation bilaterally. Not using accessory muscles, speaking in full sentences. Toes: Small scab in place but healing well.  Impression and Recommendations:    Ingrown toenail Essentially completely healed at this point.  Obesity Starting phentermine, return in one month for weight check and refills. Referral to nutrition. She is unsure which antidepressants she is on, however the SSRIs are typically weight positive, we can consider switching her to Wellbutrin. Checking hemoglobin A1c, CBC, CMP, TSH.  I spent 25 minutes with this patient, greater than 50% was face-to-face time counseling regarding the above diagnoses

## 2016-01-31 ENCOUNTER — Ambulatory Visit: Payer: BLUE CROSS/BLUE SHIELD | Admitting: Sports Medicine

## 2016-02-13 ENCOUNTER — Ambulatory Visit: Payer: BLUE CROSS/BLUE SHIELD | Admitting: Sports Medicine

## 2016-05-14 ENCOUNTER — Encounter: Payer: Self-pay | Admitting: Physician Assistant

## 2016-05-14 ENCOUNTER — Ambulatory Visit (INDEPENDENT_AMBULATORY_CARE_PROVIDER_SITE_OTHER): Payer: BLUE CROSS/BLUE SHIELD | Admitting: Physician Assistant

## 2016-05-14 VITALS — BP 117/82 | HR 80 | Temp 98.3°F | Wt 195.0 lb

## 2016-05-14 DIAGNOSIS — J069 Acute upper respiratory infection, unspecified: Secondary | ICD-10-CM | POA: Diagnosis not present

## 2016-05-14 DIAGNOSIS — R110 Nausea: Secondary | ICD-10-CM

## 2016-05-14 DIAGNOSIS — R002 Palpitations: Secondary | ICD-10-CM

## 2016-05-14 DIAGNOSIS — R55 Syncope and collapse: Secondary | ICD-10-CM | POA: Diagnosis not present

## 2016-05-14 LAB — POCT URINE PREGNANCY: Preg Test, Ur: NEGATIVE

## 2016-05-14 MED ORDER — AMOXICILLIN-POT CLAVULANATE 875-125 MG PO TABS: 1.0000 | ORAL_TABLET | Freq: Two times a day (BID) | ORAL | 0 refills | Status: AC

## 2016-05-14 NOTE — Progress Notes (Signed)
HPI:                                                                Kelli Gill is a 21 y.o. female who presents to [facility] today for nausea and dizziness  Patient reports intermittent nausea and "dizzy spells" x 3 weeks. Patient states episodes last less than a minute. Patient reports most recent episode occurred while at a training for work last week. She describes sudden onset of becoming lightheaded, nauseous and weak, her "legs gave out," she had to hold a friend's shoulder to get her bearings, and she temporarily lost her field of vision. She denies headache, paresthesias, or loss of consciousness. She also reports intermittent "heart flutters" at rest for months. Denies associated chest pain, dyspnea, or diaphoresis.  She also reports nasal congestion and ear fullness x 2 weeks. Denies fever, chills, cough, dyspnea.  Past Medical History:  Diagnosis Date  . Depression   . GERD (gastroesophageal reflux disease)    Past Surgical History:  Procedure Laterality Date  . WISDOM TOOTH EXTRACTION     Social History  Substance Use Topics  . Smoking status: Never Smoker  . Smokeless tobacco: Never Used  . Alcohol use No   family history includes Diabetes in her maternal grandfather and mother; Hyperlipidemia in her father; Hypertension in her father.  ROS: negative except as noted in the HPI  Medications: Current Outpatient Prescriptions  Medication Sig Dispense Refill  . KAITLIB FE 0.8-25 MG-MCG tablet Chew 0.8-25 mcg by mouth daily.     No current facility-administered medications for this visit.    No Known Allergies   Objective:  BP 117/82   Pulse 80   Temp 98.3 F (36.8 C) (Oral)   Wt 195 lb (88.5 kg)   BMI 32.45 kg/m  Gen: well-groomed, cooperative, not ill-appearing, no distress HEENT: normal conjunctiva, TM's clear, nasal mucosa edematous, oropharynx clear, moist mucus membranes Pulm: Normal work of  breathing, normal phonation, clear to auscultation bilaterally, no wheezes, rales or rhonchi CV: Normal rate, irregular rhythm, s1 and s2 distinct, no murmurs, clicks or rubs, no carotid bruit GI: bowel sounds active, soft, nondistended, nontender, no masses Neuro: alert and oriented x 3, EOM's intact, PERRLA, no nystagmus, normal tone, no tremor MSK: strength 5/5 and symmetric in bilateral upper and lower extremities, normal gait and station Skin: warm and dry, no rashes or lesions on exposed skin, no cyanosis  ECG 05/14/2016 Vent rate 77 bpm PR-I 150 QTc 432 NSR with sinus arrhythmia  Assessment and Plan: 21 y.o. female with   1. Pre-syncope - ECG 12-lead shows NSR with sinus arrhythmia at 77 bpm, normal PR-I, normal QTc - Holter monitor - 48 hour; Future  2. Palpitations - Holter monitor - 48 hour; Future - CBC - Comprehensive metabolic panel - Hemoglobin A1c - Lipid Panel w/reflex Direct LDL - TSH - T4, free - EKG 12-Lead  3. Acute upper respiratory infection - amoxicillin-clavulanate (AUGMENTIN) 875-125 MG tablet; Take 1 tablet by mouth 2 (two) times daily.  Dispense: 20 tablet; Refill: 0  4. Nausea - POCT urine pregnancy negative  Patient education and anticipatory guidance given Patient agrees with treatment plan Follow-up in 1 week or sooner as needed if symptoms worsen  or fail to improve  Darlyne Russian PA-C

## 2016-05-14 NOTE — Patient Instructions (Signed)
Near-Syncope °Near-syncope is when you suddenly become weak or dizzy, or you feel like you might pass out (faint). During an episode of near-syncope, you may: °· Feel dizzy or light-headed. °· Feel nauseous. °· See all white or all black in your field of vision. °· Have cold, clammy skin. ° °This condition is caused by a sudden decrease in blood flow to the brain. This decrease can result from various causes, but most of those causes are not dangerous. However, near-syncope can be a sign of a serious medical problem, so it is important to seek medical care. °If you fainted, get medical help right away.Call your local emergency services (911 in the [place] Do not drive yourself to the hospital. °Follow these instructions at home: °Pay attention to any changes in your symptoms. Take these actions to help with your condition: °· Have someone stay with you until you feel stable. °· Do not drive, use machinery, or play sports until your health care provider says it is okay. °· Keep all follow-up visits as told by your health care provider. This is important. °· If you start to feel like you might faint, lie down right away and raise (elevate) your feet above the level of your heart. Breathe deeply and steadily. Wait until all of the symptoms have passed. °· Drink enough fluid to keep your urine clear or pale yellow. °· If you are taking blood pressure or heart medicine, get up slowly and take several minutes to sit and then stand. This can reduce dizziness. °· Take over-the-counter and prescription medicines only as told by your health care provider. ° °Get help right away if: °· You have a severe headache. °· You have unusual pain in your chest, abdomen, or back. °· You are bleeding from your mouth or rectum, or you have black or tarry stool. °· You have a very fast or irregular heartbeat (palpitations). °· You faint once or repeatedly. °· You have a seizure. °· You are confused. °· You have trouble walking. °· You have  severe weakness. °· You have vision problems. °These symptoms may represent a serious problem that is an emergency. Do not wait to see if your symptoms will go away. Get medical help right away. Call your local emergency services (911 in the [place] Do not drive yourself to the hospital. °This information is not intended to replace advice given to you by your health care provider. Make sure you discuss any questions you have with your health care provider. °Document Released: 02/11/2005 Document Revised: 07/20/2015 Document Reviewed: 10/26/2014 °Elsevier Interactive Patient Education © 2017 Elsevier Inc. ° °

## 2016-05-15 DIAGNOSIS — R002 Palpitations: Secondary | ICD-10-CM | POA: Insufficient documentation

## 2016-05-16 LAB — COMPREHENSIVE METABOLIC PANEL
ALBUMIN: 4.1 g/dL (ref 3.6–5.1)
ALT: 13 U/L (ref 6–29)
AST: 15 U/L (ref 10–30)
Alkaline Phosphatase: 65 U/L (ref 33–115)
BUN: 11 mg/dL (ref 7–25)
CHLORIDE: 106 mmol/L (ref 98–110)
CO2: 23 mmol/L (ref 20–31)
CREATININE: 0.64 mg/dL (ref 0.50–1.10)
Calcium: 9 mg/dL (ref 8.6–10.2)
Glucose, Bld: 82 mg/dL (ref 65–99)
Potassium: 4.2 mmol/L (ref 3.5–5.3)
SODIUM: 140 mmol/L (ref 135–146)
Total Bilirubin: 0.3 mg/dL (ref 0.2–1.2)
Total Protein: 6.7 g/dL (ref 6.1–8.1)

## 2016-05-16 LAB — HEMOGLOBIN A1C
Hgb A1c MFr Bld: 4.9 % (ref <5.7)
MEAN PLASMA GLUCOSE: 94 mg/dL

## 2016-05-16 LAB — LIPID PANEL W/REFLEX DIRECT LDL
CHOLESTEROL: 116 mg/dL (ref <200)
HDL: 37 mg/dL — AB (ref >50)
LDL-Cholesterol: 63 mg/dL
Non-HDL Cholesterol (Calc): 79 mg/dL (ref <130)
TRIGLYCERIDES: 84 mg/dL (ref <150)
Total CHOL/HDL Ratio: 3.1 Ratio (ref <5.0)

## 2016-05-16 LAB — CBC
HCT: 40.4 % (ref 35.0–45.0)
Hemoglobin: 13.6 g/dL (ref 11.7–15.5)
MCH: 29.6 pg (ref 27.0–33.0)
MCHC: 33.7 g/dL (ref 32.0–36.0)
MCV: 87.8 fL (ref 80.0–100.0)
MPV: 9.6 fL (ref 7.5–12.5)
Platelets: 306 10*3/uL (ref 140–400)
RBC: 4.6 MIL/uL (ref 3.80–5.10)
RDW: 12.8 % (ref 11.0–15.0)
WBC: 9.2 10*3/uL (ref 3.8–10.8)

## 2016-05-16 LAB — TSH: TSH: 1 mIU/L

## 2016-05-16 LAB — T4, FREE: Free T4: 1 ng/dL (ref 0.8–1.8)

## 2016-07-04 DIAGNOSIS — Z6832 Body mass index (BMI) 32.0-32.9, adult: Secondary | ICD-10-CM | POA: Diagnosis not present

## 2016-07-04 DIAGNOSIS — Z01419 Encounter for gynecological examination (general) (routine) without abnormal findings: Secondary | ICD-10-CM | POA: Diagnosis not present

## 2016-07-08 DIAGNOSIS — N938 Other specified abnormal uterine and vaginal bleeding: Secondary | ICD-10-CM | POA: Diagnosis not present

## 2016-11-29 ENCOUNTER — Ambulatory Visit (INDEPENDENT_AMBULATORY_CARE_PROVIDER_SITE_OTHER): Payer: BLUE CROSS/BLUE SHIELD | Admitting: Sports Medicine

## 2016-11-29 ENCOUNTER — Encounter: Payer: Self-pay | Admitting: Sports Medicine

## 2016-11-29 DIAGNOSIS — F32A Depression, unspecified: Secondary | ICD-10-CM | POA: Insufficient documentation

## 2016-11-29 DIAGNOSIS — R0982 Postnasal drip: Secondary | ICD-10-CM

## 2016-11-29 DIAGNOSIS — F329 Major depressive disorder, single episode, unspecified: Secondary | ICD-10-CM

## 2016-11-29 DIAGNOSIS — F419 Anxiety disorder, unspecified: Secondary | ICD-10-CM | POA: Insufficient documentation

## 2016-11-29 DIAGNOSIS — R4589 Other symptoms and signs involving emotional state: Secondary | ICD-10-CM

## 2016-11-29 MED ORDER — HYDROCOD POLST-CPM POLST ER 10-8 MG/5ML PO SUER: 5.0000 mL | Freq: Two times a day (BID) | ORAL | 0 refills | Status: DC | PRN

## 2016-11-29 MED ORDER — BENZONATATE 200 MG PO CAPS: 200.0000 mg | ORAL_CAPSULE | Freq: Three times a day (TID) | ORAL | 0 refills | Status: DC | PRN

## 2016-11-29 MED ORDER — FLUTICASONE PROPIONATE 50 MCG/ACT NA SUSP: NASAL | 3 refills | Status: DC

## 2016-11-29 NOTE — Assessment & Plan Note (Signed)
I advised patient that major depression was extremely common and that there were several ways to treat the symptoms, she will make a appointment with her PCP to discuss this in detail.

## 2016-11-29 NOTE — Assessment & Plan Note (Signed)
Flonase, Tussionex, Tessalon Perles. Return in a week if no better. Out of work today and tomorrow.

## 2016-11-29 NOTE — Progress Notes (Signed)
  Subjective:    CC: Coughing  HPI: This is a pleasant 21 year old female, she works in the food service injury, for the past week she's had runny nose, cough that's minimally productive, mild malaise. No fevers or chills, no GI symptoms, no skin rash. Symptoms are moderate, persistent.  At the end of the visit she mentioned that she had had some depressed mood, no suicidal or homicidal ideation.  Past medical history:  Negative.  See flowsheet/record as well for more information.  Surgical history: Negative.  See flowsheet/record as well for more information.  Family history: Negative.  See flowsheet/record as well for more information.  Social history: Negative.  See flowsheet/record as well for more information.  Allergies, and medications have been entered into the medical record, reviewed, and no changes needed.   Review of Systems: No fevers, chills, night sweats, weight loss, chest pain, or shortness of breath.   Objective:    General: Well Developed, well nourished, and in no acute distress.  Neuro: Alert and oriented x3, extra-ocular muscles intact, sensation grossly intact.  HEENT: Normocephalic, atraumatic, pupils equal round reactive to light, neck supple, no masses, no lymphadenopathy, thyroid nonpalpable. Oropharynx, nasopharynx, ear canals unremarkable. Skin: Warm and dry, no rashes. Cardiac: Regular rate and rhythm, no murmurs rubs or gallops, no lower extremity edema.  Respiratory: Clear to auscultation bilaterally. Not using accessory muscles, speaking in full sentences.  Impression and Recommendations:    Postnasal drip with bronchitis Flonase, Tussionex, Tessalon Perles. Return in a week if no better. Out of work today and tomorrow.  Depressed mood I advised patient that major depression was extremely common and that there were several ways to treat the symptoms, she will make a appointment with her PCP to discuss this in  detail.  ___________________________________________ Ihor Austin. Benjamin Stain, M.D., ABFM., CAQSM. Primary Care and Sports Medicine [facility]  Adjunct Instructor of Family Medicine  [employer]

## 2016-12-10 ENCOUNTER — Encounter: Payer: Self-pay | Admitting: Family Medicine

## 2016-12-10 ENCOUNTER — Ambulatory Visit (INDEPENDENT_AMBULATORY_CARE_PROVIDER_SITE_OTHER): Payer: BLUE CROSS/BLUE SHIELD | Admitting: Family Medicine

## 2016-12-10 VITALS — BP 106/53 | HR 76 | Ht 65.0 in | Wt 183.0 lb

## 2016-12-10 DIAGNOSIS — F331 Major depressive disorder, recurrent, moderate: Secondary | ICD-10-CM | POA: Diagnosis not present

## 2016-12-10 DIAGNOSIS — R635 Abnormal weight gain: Secondary | ICD-10-CM

## 2016-12-10 DIAGNOSIS — Z6829 Body mass index (BMI) 29.0-29.9, adult: Secondary | ICD-10-CM

## 2016-12-10 MED ORDER — FLUOXETINE HCL 10 MG PO CAPS: ORAL_CAPSULE | ORAL | 0 refills | Status: DC

## 2016-12-10 NOTE — Patient Instructions (Addendum)
Recommend a smart phone application called my fitness pal to help track calories for the next month. If you're still struggling losing weight at that point and come in and see me again so we can discuss additional strategies.  Normal weight based on your BMI and height would be 110-148 lbs.

## 2016-12-10 NOTE — Progress Notes (Signed)
Subjective:    Patient ID: Kelli Gill, female    DOB: 1995/12/05, 21 y.o.   MRN: [id number]  HPI 21 year old all is up today for depressed mood. She was seen a couple of weeks ago for bronchitis and at that time she also reported some depressed mood. Denied any suicidal ideation at that time. She was encouraged to schedule with PCP.Patient reports little interest or pleasure in doing things several days a week and feeling down and depressed and hopeless several days of the week. Also complains of fatigue and difficulty with sleep. Denies thoughts of self-harm.She can wonders if her birth control could be affecting her mood as well. She says she just has days where she just feels down and sometimes a little anxious. It's not every day. There is nothing specific going on in her life that she feels is triggering it. She actually has been back at work for the last 2 months instead of staying home with her child and that actually has improved her mood somewhat. She is able to put on a good face and go to work and interact but then when she comes home she just feels very low again. She feels like she's been more irritable than usual and more tired. She has trouble sleeping several days of the week.  She is really struggling with weight loss. She had her baby 18 months ago and this has not been able to lose the weight. She tries to eat very healthy and feels like she actually does a really good job with that. She's been exercising some at home but hasn't been able to make it to the gym because of childcare.   Review of Systems  BP (!) 106/53   Pulse 76   Ht  (1.651 m)   Wt 183 lb (83 kg)   SpO2 98%   BMI 30.45 kg/m     No Known Allergies  Past Medical History:  Diagnosis Date  . Depression   . GERD (gastroesophageal reflux disease)     Past Surgical History:  Procedure Laterality Date  . WISDOM TOOTH EXTRACTION      Social History   Social History  . Marital status: Single   Spouse name: N/A  . Number of children: N/A  . Years of education: N/A   Occupational History  . Not on file.   Social History Main Topics  . Smoking status: Never Smoker  . Smokeless tobacco: Never Used  . Alcohol use No  . Drug use: No  . Sexual activity: Yes    Birth control/ protection: Pill   Other Topics Concern  . Not on file   Social History Narrative  . No narrative on file    Family History  Problem Relation Age of Onset  . Diabetes Mother   . Hypertension Father   . Hyperlipidemia Father   . Diabetes Maternal Grandfather     Outpatient Encounter Prescriptions as of 12/10/2016  Medication Sig  . fluticasone (FLONASE) 50 MCG/ACT nasal spray One spray in each nostril twice a day, use left hand for right nostril, and right hand for left nostril.  Marland Kitchen KAITLIB FE 0.8-25 MG-MCG tablet Chew 0.8-25 mcg by mouth daily.  . [DISCONTINUED] benzonatate (TESSALON) 200 MG capsule Take 1 capsule (200 mg total) by mouth 3 (three) times daily as needed for cough.  . [DISCONTINUED] chlorpheniramine-HYDROcodone (TUSSIONEX) 10-8 MG/5ML SUER Take 5 mLs by mouth every 12 (twelve) hours as needed for cough (cough, will cause drowsiness.).  Marland Kitchen  FLUoxetine (PROZAC) 10 MG capsule One tab QD x 5 days, then 2 a day   No facility-administered encounter medications on file as of 12/10/2016.          Objective:   Physical Exam  Constitutional: She is oriented to person, place, and time. She appears well-developed and well-nourished.  HENT:  Head: Normocephalic and atraumatic.  Cardiovascular: Normal rate, regular rhythm and normal heart sounds.   Pulmonary/Chest: Effort normal and breath sounds normal.  Neurological: She is alert and oriented to person, place, and time.  Skin: Skin is warm and dry.  Psychiatric: She has a normal mood and affect. Her behavior is normal.          Assessment & Plan:  Depression- Recurrent. Moderate. PHQ 9 score of 8 today. Discussed options.Overall  she actually really has very good insight. She doesn't feel like there is a specific seeing her cause of her mood change just feels like it's in over arching feeling that she is experiencing. She says it's not even every day but it is most days. Discussed the importance of working on self-care and getting adequate sleep rest and exercise. We also discussed focusing on weight loss which I think would be helpful as well. She just feels very down on herself because of her current body habitus. Again we discussed possibly restarting fluoxetine which she had taken previously and may be keeping it at a low dose. We'll start with 10 mg and have her go up to 20 mg after one week. I like to see her back in 1-2 months to see how well she is doing.  Abnormal weight gain/BMI of 29.9-discussed diagnosis and discussed strategies around getting better. Recommended a smart phone application call my fitness pal to help set goals and track calories. Encouraged her to try to increase her activity level and get into a regular exercise routine which will help with mood as well. Again I like to see her back in 1-2 months to make sure that she is doing well and is making some progress and not getting frustrated. I think this will help her get a better grasp on what she is consuming. It sounds like overall she is making healthy choices which is very reassuring.  Time spent 30 minutes, greater than 50% time spent discussing depression and overweight.

## 2017-01-07 ENCOUNTER — Ambulatory Visit: Payer: BLUE CROSS/BLUE SHIELD | Admitting: Family Medicine

## 2017-03-07 ENCOUNTER — Encounter: Payer: Self-pay | Admitting: Family Medicine

## 2017-08-12 ENCOUNTER — Ambulatory Visit: Payer: BLUE CROSS/BLUE SHIELD | Admitting: Physician Assistant

## 2017-08-13 ENCOUNTER — Ambulatory Visit: Payer: BLUE CROSS/BLUE SHIELD | Admitting: Physician Assistant

## 2017-08-13 ENCOUNTER — Encounter: Payer: Self-pay | Admitting: Physician Assistant

## 2017-08-13 VITALS — BP 108/66 | HR 69 | Temp 98.3°F | Wt 182.0 lb

## 2017-08-13 DIAGNOSIS — W57XXXA Bitten or stung by nonvenomous insect and other nonvenomous arthropods, initial encounter: Secondary | ICD-10-CM | POA: Diagnosis not present

## 2017-08-13 DIAGNOSIS — S40862A Insect bite (nonvenomous) of left upper arm, initial encounter: Secondary | ICD-10-CM | POA: Diagnosis not present

## 2017-08-13 DIAGNOSIS — S50862A Insect bite (nonvenomous) of left forearm, initial encounter: Secondary | ICD-10-CM | POA: Diagnosis not present

## 2017-08-13 MED ORDER — TRIAMCINOLONE ACETONIDE 0.5 % EX CREA: 1.0000 "application " | TOPICAL_CREAM | Freq: Two times a day (BID) | CUTANEOUS | 3 refills | Status: DC

## 2017-08-13 NOTE — Patient Instructions (Addendum)
Benadryl for nighttime itch, Zyrtec for daytime itch Cool compresses Anti-irritant agents (e.g. sarna anti-itch) Protective agents (e.g. Calamine) Triamcinolone cream up to twice a day on extremely itchy spots   Bedbugs Bedbugs are tiny bugs that live in and around beds. They stay hidden during the day, and they come out at night and bite. Bedbugs need blood to live and grow. Where are bedbugs found? Bedbugs can be found anywhere, whether a place is clean or dirty. They are most often found in places where many people come and go, such as hotels, shelters, dorms, and health care settings. It is also common for them to be found in homes where there are many birds or bats nearby. What are bedbug bites like? A bedbug bite leaves a small red bump with a darker red dot in the middle. The bump may appear soon after a person is bitten or a day or more later. Bedbug bites usually do not hurt, but they may itch. Most people do not need treatment for bedbug bites. The bumps usually go away on their own in a few days. How do I check for bedbugs? Bedbugs are reddish-brown, oval, and flat. They range in size from 1 mm to 7 mm and they cannot fly. Look for bedbugs in these places:  On mattresses, bed frames, headboards, and box springs.  On drapes and curtains in bedrooms.  Under carpeting in bedrooms.  Behind electrical outlets.  Behind any wallpaper that is peeling.  Inside luggage.  Also look for black or red spots or stains on or near the bed. Stains can come from bedbugs that have been crushed or from bedbug waste. What should I do if I find bedbugs? When Traveling If you find bedbugs while traveling, check all of your possessions carefully before you bring them into your home. Consider throwing away anything that has bedbugs on it. At Home If you find bedbugs at home, your bedroom may need to be treated by a pest control expert. You may also need to throw away mattresses or luggage. To help  keep bedbugs from coming back, consider taking these actions:  Put a plastic cover over your mattress.  Wash your clothes and bedding in water that is hotter than 120F (48.9C) and dry them on a hot setting. Bedbugs are killed by high temperatures.  Vacuum often around the bed and in all of the cracks and crevices where the bugs might hide.  Carefully check all used furniture, bedding, or clothes that you bring into your home.  Eliminate bird nests and bat roosts that are near your home.  In Your Bed If you find bedbugs in your bed, consider wearing pajamas that have long sleeves and pant legs. Bedbugs usually bite areas of the skin that are not covered. This information is not intended to replace advice given to you by your health care provider. Make sure you discuss any questions you have with your health care provider. Document Released: 03/16/2010 Document Revised: 07/20/2015 Document Reviewed: 02/07/2014 Elsevier Interactive Patient Education  [employer]2018 Elsevier Inc.

## 2017-08-13 NOTE — Progress Notes (Signed)
HPI:                                                                Kelli Gill is a 22 y.o. female who [facility] today for "bug bites"  Onset: 3-5 days ago Location: started on left upper extremity and left upper thigh, spread to left flank and left shoulder Duration: constant Character: pruritic and burning Aggravating factors / Triggers: nothing Evolution: gradually improving Treatments tried: alcohol, benadryl  Recent illness / systemic symptoms: none  Medication / drug exposure: no [place], stayed in a hotel Animal/insect exposure: 2 dogs, 1 [place] History of allergies: none Exposure to new soaps, perfumes, cleaning products: no Exposure to chemicals: no  Past Medical History:  Diagnosis Date  . Depression   . GERD (gastroesophageal reflux disease)    Past Surgical History:  Procedure Laterality Date  . WISDOM TOOTH EXTRACTION     Social History   Tobacco Use  . Smoking status: Never Smoker  . Smokeless tobacco: Never Used  Substance Use Topics  . Alcohol use: No   family history includes Diabetes in her maternal grandfather and mother; Hyperlipidemia in her father; Hypertension in her father.    ROS: negative except as noted in the HPI  Medications: Current Outpatient Medications  Medication Sig Dispense Refill  . FLUoxetine (PROZAC) 10 MG capsule One tab QD x 5 days, then 2 a day 60 capsule 0  . fluticasone (FLONASE) 50 MCG/ACT nasal spray One spray in each nostril twice a day, use left hand for right nostril, and right hand for left nostril. 48 g 3  . KAITLIB FE 0.8-25 MG-MCG tablet Chew 0.8-25 mcg by mouth daily.    Marland Kitchen. triamcinolone cream (KENALOG) 0.5 % Apply 1 application topically 2 (two) times daily. To affected areas. 30 g 3   No current facility-administered medications for this visit.    No Known Allergies     Objective:  BP 108/66   Pulse 69   Temp  98.3 F (36.8 C) (Oral)   Wt 182 lb (82.6 kg)   BMI 30.29 kg/m  Gen:  alert, not ill-appearing, no distress, appropriate for age, obese female HEENT: head normocephalic without obvious abnormality, conjunctiva and cornea clear, trachea midline Pulm: Normal work of breathing, normal phonation Neuro: alert and oriented x 3, no tremor MSK: extremities atraumatic, normal gait and station Skin: erythematous papules and wheals in a linear array on extensor aspect of left upper extremity and left medial upper thigh Psych: well-groomed, cooperative, good eye contact, euthymic mood, affect mood-congruent, speech is articulate, and thought processes clear and goal-directed    No results found for this or any previous visit (from the past 72 hour(s)). No results found.    Assessment and Plan: 22 y.o. female with   Insect bite, unspecified site, initial encounter - Plan: triamcinolone cream (KENALOG) 0.5 % - differential includes bed bug bites - symptomatic management with topical steroid, Sarna, cool compresses and antihistamine prn - counseled to look for source and treat accordingly     Patient education and anticipatory guidance given Patient agrees with treatment plan Follow-up as needed if symptoms worsen or fail to improve  Levonne Hubertharley E. Cummings PA-C

## 2017-08-17 ENCOUNTER — Encounter: Payer: Self-pay | Admitting: Physician Assistant

## 2017-10-22 DIAGNOSIS — Z124 Encounter for screening for malignant neoplasm of cervix: Secondary | ICD-10-CM | POA: Diagnosis not present

## 2017-10-22 DIAGNOSIS — Z6830 Body mass index (BMI) 30.0-30.9, adult: Secondary | ICD-10-CM | POA: Diagnosis not present

## 2017-10-22 DIAGNOSIS — Z01419 Encounter for gynecological examination (general) (routine) without abnormal findings: Secondary | ICD-10-CM | POA: Diagnosis not present

## 2017-10-28 LAB — HM PAP SMEAR: HM PAP: NEGATIVE

## 2018-01-07 ENCOUNTER — Encounter: Payer: Self-pay | Admitting: Physician Assistant

## 2018-01-07 ENCOUNTER — Ambulatory Visit (INDEPENDENT_AMBULATORY_CARE_PROVIDER_SITE_OTHER): Payer: BLUE CROSS/BLUE SHIELD | Admitting: Physician Assistant

## 2018-01-07 VITALS — BP 120/79 | HR 99 | Temp 98.7°F | Wt 184.9 lb

## 2018-01-07 DIAGNOSIS — R10813 Right lower quadrant abdominal tenderness: Secondary | ICD-10-CM | POA: Diagnosis not present

## 2018-01-07 DIAGNOSIS — R102 Pelvic and perineal pain: Secondary | ICD-10-CM

## 2018-01-07 DIAGNOSIS — R110 Nausea: Secondary | ICD-10-CM

## 2018-01-07 DIAGNOSIS — R52 Pain, unspecified: Secondary | ICD-10-CM

## 2018-01-07 LAB — COMPLETE METABOLIC PANEL WITH GFR
AG Ratio: 1.5 (calc) (ref 1.0–2.5)
ALT: 9 U/L (ref 6–29)
AST: 11 U/L (ref 10–30)
Albumin: 4 g/dL (ref 3.6–5.1)
Alkaline phosphatase (APISO): 59 U/L (ref 33–115)
BUN: 9 mg/dL (ref 7–25)
CALCIUM: 9.1 mg/dL (ref 8.6–10.2)
CO2: 28 mmol/L (ref 20–32)
Chloride: 106 mmol/L (ref 98–110)
Creat: 0.81 mg/dL (ref 0.50–1.10)
GFR, EST NON AFRICAN AMERICAN: 102 mL/min/{1.73_m2} (ref >=60)
GFR, Est African American: 118 mL/min/{1.73_m2} (ref >=60)
GLUCOSE: 89 mg/dL (ref 65–99)
Globulin: 2.6 g/dL (calc) (ref 1.9–3.7)
Potassium: 4.2 mmol/L (ref 3.5–5.3)
Sodium: 138 mmol/L (ref 135–146)
TOTAL PROTEIN: 6.6 g/dL (ref 6.1–8.1)
Total Bilirubin: 0.3 mg/dL (ref 0.2–1.2)

## 2018-01-07 LAB — CBC WITH DIFFERENTIAL/PLATELET
BASOS PCT: 0.6 %
Basophils Absolute: 38 cells/uL (ref 0–200)
EOS PCT: 1.8 %
Eosinophils Absolute: 113 cells/uL (ref 15–500)
HCT: 38 % (ref 35.0–45.0)
Hemoglobin: 13 g/dL (ref 11.7–15.5)
Lymphs Abs: 1607 cells/uL (ref 850–3900)
MCH: 29.5 pg (ref 27.0–33.0)
MCHC: 34.2 g/dL (ref 32.0–36.0)
MCV: 86.2 fL (ref 80.0–100.0)
MONOS PCT: 10.5 %
MPV: 10.4 fL (ref 7.5–12.5)
NEUTROS PCT: 61.6 %
Neutro Abs: 3881 cells/uL (ref 1500–7800)
PLATELETS: 277 10*3/uL (ref 140–400)
RBC: 4.41 10*6/uL (ref 3.80–5.10)
RDW: 11.6 % (ref 11.0–15.0)
Total Lymphocyte: 25.5 %
WBC: 6.3 10*3/uL (ref 3.8–10.8)
WBCMIX: 662 {cells}/uL (ref 200–950)

## 2018-01-07 LAB — POCT URINALYSIS DIPSTICK
Bilirubin, UA: NEGATIVE
GLUCOSE UA: NEGATIVE
Ketones, UA: NEGATIVE
Leukocytes, UA: NEGATIVE
NITRITE UA: NEGATIVE
PH UA: 7.5 (ref 5.0–8.0)
PROTEIN UA: NEGATIVE
Spec Grav, UA: 1.02 (ref 1.010–1.025)
Urobilinogen, UA: 4 E.U./dL — AB

## 2018-01-07 MED ORDER — ONDANSETRON HCL 8 MG PO TABS: 8.0000 mg | ORAL_TABLET | Freq: Three times a day (TID) | ORAL | 0 refills | Status: DC | PRN

## 2018-01-07 MED ORDER — PROMETHAZINE HCL 25 MG/ML IJ SOLN
25.0000 mg | Freq: Once | INTRAMUSCULAR | Status: AC
  Administered 2018-01-07: 25 mg via INTRAMUSCULAR

## 2018-01-07 NOTE — Patient Instructions (Signed)
Will get CBC and call with results.  Stay hydrated.  zofran for nausea.

## 2018-01-07 NOTE — Progress Notes (Signed)
Subjective:    Patient ID: Kelli Gill, female    DOB: 05-28-1995, 22 y.o.   MRN: [id number]  HPI Pt is a 22 yo female who presents to the clinic with body aches and nausea that started suddenly yesterday. She reported she almost felt like she had the flu but no URI symptoms or fever. She has no ST. She denies any dysuria. She has had UTI's before but have not felt like this. She is very nauseated but not vomited. Pt is on birth control and not trying to get pregnant. No SOB, cough or wheezing. No melena or hematochezia.   Marland Kitchen.. Active Ambulatory Problems    Diagnosis Date Noted  . Plantar fasciitis 08/11/2012  . Eczematous dermatitis 09/07/2012  . Left foot pain 12/18/2015  . Ingrown toenail 12/18/2015  . Obesity 01/15/2016  . Palpitations 05/15/2016  . Postnasal drip with bronchitis 11/29/2016  . Depressed mood 11/29/2016   Resolved Ambulatory Problems    Diagnosis Date Noted  . BRADYCARDIA 10/23/2009  . ABDOMINAL PAIN 11/23/2009   Past Medical History:  Diagnosis Date  . Depression   . GERD (gastroesophageal reflux disease)       Review of Systems    see HPI>  Objective:   Physical Exam  Constitutional: She is oriented to person, place, and time. She appears well-developed and well-nourished.  HENT:  Head: Normocephalic and atraumatic.  Right Ear: External ear normal.  Left Ear: External ear normal.  Nose: Nose normal.  Mouth/Throat: Oropharynx is clear and moist. No oropharyngeal exudate.  Eyes: Pupils are equal, round, and reactive to light. Conjunctivae and EOM are normal.  Neck: Normal range of motion.  Cardiovascular: Normal rate and regular rhythm.  Pulmonary/Chest: Effort normal and breath sounds normal.  No CVA tenderness.   Abdominal: Soft. Bowel sounds are normal. She exhibits no distension and no mass. There is tenderness. There is guarding. There is no rebound.  Lymphadenopathy:    She has no cervical adenopathy.  Neurological: She is alert and  oriented to person, place, and time.  Skin: No rash noted.  Psychiatric: She has a normal mood and affect. Her behavior is normal.          Assessment & Plan:  Marland Kitchen.Marland Kitchen.Kelli Gill was seen today for nausea.  Diagnoses and all orders for this visit:  Right lower quadrant abdominal tenderness without rebound tenderness -     POCT Urinalysis Dipstick -     Cancel: POCT Urinalysis Dip Manual -     Cancel: CBC with Differential/Platelet -     BASIC METABOLIC PANEL WITH GFR -     CBC with Differential/Platelet -     COMPLETE METABOLIC PANEL WITH GFR  Generalized body aches -     Cancel: CBC with Differential/Platelet -     BASIC METABOLIC PANEL WITH GFR -     CBC with Differential/Platelet -     COMPLETE METABOLIC PANEL WITH GFR -     promethazine (PHENERGAN) injection 25 mg  Suprapubic pain -     Cancel: POCT Urinalysis Dip Manual -     Cancel: CBC with Differential/Platelet -     BASIC METABOLIC PANEL WITH GFR -     CBC with Differential/Platelet -     COMPLETE METABOLIC PANEL WITH GFR  Nausea -     Cancel: CBC with Differential/Platelet -     BASIC METABOLIC PANEL WITH GFR -     ondansetron (ZOFRAN) 8 MG tablet; Take 1 tablet (8 mg total) by  mouth every 8 (eight) hours as needed for nausea or vomiting. -     CBC with Differential/Platelet -     COMPLETE METABOLIC PANEL WITH GFR -     promethazine (PHENERGAN) injection 25 mg  .. Results for orders placed or performed in visit on 01/07/18  CBC with Differential/Platelet  Result Value Ref Range   WBC 6.3 3.8 - 10.8 Thousand/uL   RBC 4.41 3.80 - 5.10 Million/uL   Hemoglobin 13.0 11.7 - 15.5 g/dL   HCT 16.1 09.6 - 04.5 %   MCV 86.2 80.0 - 100.0 fL   MCH 29.5 27.0 - 33.0 pg   MCHC 34.2 32.0 - 36.0 g/dL   RDW 40.9 81.1 - 91.4 %   Platelets 277 140 - 400 Thousand/uL   MPV 10.4 7.5 - 12.5 fL   Neutro Abs 3,881 1,500 - 7,800 cells/uL   Lymphs Abs 1,607 850 - 3,900 cells/uL   WBC mixed population 662 200 - 950 cells/uL    Eosinophils Absolute 113 15 - 500 cells/uL   Basophils Absolute 38 0 - 200 cells/uL   Neutrophils Relative % 61.6 %   Total Lymphocyte 25.5 %   Monocytes Relative 10.5 %   Eosinophils Relative 1.8 %   Basophils Relative 0.6 %  COMPLETE METABOLIC PANEL WITH GFR  Result Value Ref Range   Glucose, Bld 89 65 - 99 mg/dL   BUN 9 7 - 25 mg/dL   Creat 7.82 9.56 - 2.13 mg/dL   GFR, Est Non African American 102 > OR = 60 mL/min/1.53m2   GFR, Est African American 118 > OR = 60 mL/min/1.42m2   BUN/Creatinine Ratio NOT APPLICABLE 6 - 22 (calc)   Sodium 138 135 - 146 mmol/L   Potassium 4.2 3.5 - 5.3 mmol/L   Chloride 106 98 - 110 mmol/L   CO2 28 20 - 32 mmol/L   Calcium 9.1 8.6 - 10.2 mg/dL   Total Protein 6.6 6.1 - 8.1 g/dL   Albumin 4.0 3.6 - 5.1 g/dL   Globulin 2.6 1.9 - 3.7 g/dL (calc)   AG Ratio 1.5 1.0 - 2.5 (calc)   Total Bilirubin 0.3 0.2 - 1.2 mg/dL   Alkaline phosphatase (APISO) 59 33 - 115 U/L   AST 11 10 - 30 U/L   ALT 9 6 - 29 U/L  POCT Urinalysis Dipstick  Result Value Ref Range   Color, UA yellow    Clarity, UA clear    Glucose, UA Negative Negative   Bilirubin, UA neg    Ketones, UA neg    Spec Grav, UA 1.020 1.010 - 1.025   Blood, UA trace intact    pH, UA 7.5 5.0 - 8.0   Protein, UA Negative Negative   Urobilinogen, UA 4.0 (A) 0.2 or 1.0 E.U./dL   Nitrite, UA neg    Leukocytes, UA Negative Negative   Appearance     Odor     UPT was negative.   Unclear etiology for symptoms viral gastroenteritis vs appendicitis vs UTI vs kidney stone.  UA negative for leukocytes, nitrites. Likely not infectious without dysuria. Only done due to suprapubic pain.  WBC normal. Likely not appendicitis.  Nausea and GI upset like viral gastroenteritis. Phenergan given IM today with zofran to take home. Abdominal pain is more RLQ. If pain continues will consider imaging.  No fever or URI symptoms do not suspect flu.  Reassured patient that vitals are currently stable.   Stay hydrated  and consider BRAT diet.   Marland KitchenMarland Kitchen  Spent 30 minutes with patient and greater than 50 percent of visit spent counseling patient regarding treatment plan.

## 2018-01-07 NOTE — Progress Notes (Signed)
Call pt: WBC normal. Although tender over right quadrant no alarming signs of appendicitis. Likely a viral infection. Keep me posted with new symptoms.

## 2018-01-08 ENCOUNTER — Telehealth: Payer: Self-pay

## 2018-01-08 LAB — BASIC METABOLIC PANEL WITH GFR
BUN: 8 mg/dL (ref 7–25)
CALCIUM: 9.2 mg/dL (ref 8.6–10.2)
CO2: 27 mmol/L (ref 20–32)
Chloride: 106 mmol/L (ref 98–110)
Creat: 0.75 mg/dL (ref 0.50–1.10)
GFR, EST NON AFRICAN AMERICAN: 112 mL/min/{1.73_m2} (ref >=60)
GFR, Est African American: 129 mL/min/{1.73_m2} (ref >=60)
GLUCOSE: 84 mg/dL (ref 65–99)
POTASSIUM: 4.2 mmol/L (ref 3.5–5.3)
Sodium: 140 mmol/L (ref 135–146)

## 2018-01-08 NOTE — Progress Notes (Signed)
Call pt: kidney, liver perfect.

## 2018-01-08 NOTE — Telephone Encounter (Signed)
This sound consistent with viral gastroenteritis. I would suspect it will improve in 24 to 48 hours.

## 2018-01-08 NOTE — Telephone Encounter (Signed)
Kelli Gill feels better, however she is now having uncontrollable bowel movements. She gets these sharp abdominal pains then has uncontrollable bowel movement. I advised if symptoms worsened or persists to go to the ED or urgent care. Patient just wanted to up date [name].

## 2018-01-09 NOTE — Telephone Encounter (Signed)
Patient advised of recommendation.  

## 2018-03-05 ENCOUNTER — Encounter: Payer: Self-pay | Admitting: Family Medicine

## 2018-03-19 ENCOUNTER — Other Ambulatory Visit: Payer: Self-pay

## 2018-03-19 ENCOUNTER — Emergency Department (INDEPENDENT_AMBULATORY_CARE_PROVIDER_SITE_OTHER)
Admission: EM | Admit: 2018-03-19 | Discharge: 2018-03-19 | Disposition: A | Discharge status: Home / Self Care | Payer: BLUE CROSS/BLUE SHIELD | Source: Home / Self Care

## 2018-03-19 ENCOUNTER — Encounter: Payer: Self-pay | Admitting: Family Medicine

## 2018-03-19 DIAGNOSIS — J029 Acute pharyngitis, unspecified: Secondary | ICD-10-CM

## 2018-03-19 LAB — POCT INFLUENZA A/B
Influenza A, POC: NEGATIVE
Influenza B, POC: NEGATIVE

## 2018-03-19 LAB — POCT RAPID STREP A (OFFICE): Rapid Strep A Screen: NEGATIVE

## 2018-03-19 MED ORDER — CHLORHEXIDINE GLUCONATE 0.12 % MT SOLN: 15.0000 mL | Freq: Two times a day (BID) | OROMUCOSAL | 0 refills | Status: DC

## 2018-03-19 NOTE — ED Provider Notes (Signed)
[facility]    CSN: [id number] Arrival date & time: 03/19/18  1444     History   Chief Complaint Chief Complaint  Patient presents with  . Nasal Congestion  . Headache  . Sore Throat    with chills    HPI Kelli Gill is a 23 y.o. female.   23 yo established patient with URI symptoms.  She developed a sore throat 3 days ago which was associated with sweats and chills.  She is continued to have the sore throat the entire week.  Patient left work early on Tuesday and then had the day off yesterday.  She feels like she is having difficulty concentrating because of this illness.  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  Patient denies nausea, vomiting, diarrhea, cough.  Associated symptom is a sense of shortness of breath.     Past Medical History:  Diagnosis Date  . Depression   . GERD (gastroesophageal reflux disease)     Patient Active Problem List   Diagnosis Date Noted  . Postnasal drip with bronchitis 11/29/2016  . Depressed mood 11/29/2016  . Palpitations 05/15/2016  . Obesity 01/15/2016  . Left foot pain 12/18/2015  . Ingrown toenail 12/18/2015  . Eczematous dermatitis 09/07/2012  . Plantar fasciitis 08/11/2012    Past Surgical History:  Procedure Laterality Date  . WISDOM TOOTH EXTRACTION      OB History    Gravida  1   Para  1   Term      Preterm      AB      Living  1     SAB      TAB      Ectopic      Multiple      Live Births               Home Medications    Prior to Admission medications   Medication Sig Start Date End Date Taking? Authorizing Provider  chlorhexidine (PERIDEX) 0.12 % solution Use as directed 15 mLs in the mouth or throat 2 (two) times daily. 03/19/18   Elvina SidleLauenstein, Novah Goza, MD  FLUoxetine (PROZAC) 10 MG capsule One tab QD x 5 days, then 2 a day 12/10/16 12/11/17  Agapito GamesMetheney, Catherine D, MD  KAITLIB FE 0.8-25 MG-MCG tablet Chew 0.8-25 mcg by mouth daily. 05/05/16    [provider]  ondansetron (ZOFRAN) 8 MG tablet Take 1 tablet (8 mg total) by mouth every 8 (eight) hours as needed for nausea or vomiting. 01/07/18   Jomarie LongsBreeback, [name] L, PA-C    Family History Family History  Problem Relation Age of Onset  . Diabetes Mother   . Hypertension Father   . Hyperlipidemia Father   . Diabetes Maternal Grandfather     Social History Social History   Tobacco Use  . Smoking status: Never Smoker  . Smokeless tobacco: Never Used  Substance Use Topics  . Alcohol use: No  . Drug use: No     Allergies   Patient has no known allergies.   Review of Systems Review of Systems  Constitutional: Positive for chills and diaphoresis.  HENT: Positive for sore throat. Negative for rhinorrhea.   Respiratory: Positive for chest tightness and shortness of breath. Negative for cough.   All other systems reviewed and are negative.    Physical Exam Triage Vital Signs ED Triage Vitals  Enc Vitals Group     BP      Pulse  Resp      Temp      Temp src      SpO2      Weight      Height      Head Circumference      Peak Flow      Pain Score      Pain Loc      Pain Edu?      Excl. in GC?    No data found.  Updated Vital Signs BP 113/79 (BP Location: Right Arm)   Pulse 73   Temp 97.9 F (36.6 C) (Oral)   Resp 18   Ht 5\' 6"  (1.676 m)   Wt 82.6 kg   SpO2 98%   BMI 29.38 kg/m    Physical Exam Vitals signs and nursing note reviewed.  Constitutional:      Appearance: She is well-developed and normal weight.  HENT:     Head: Normocephalic and atraumatic.     Mouth/Throat:     Mouth: Mucous membranes are moist.     Pharynx: Oropharynx is clear.     Comments: Entire posterior pharynx is reddened Eyes:     Extraocular Movements: Extraocular movements intact.  Neck:     Musculoskeletal: Normal range of motion and neck supple.  Cardiovascular:     Rate and Rhythm: Normal rate and regular rhythm.     Heart sounds: Normal heart  sounds.  Pulmonary:     Effort: Pulmonary effort is normal.     Breath sounds: Normal breath sounds.  Lymphadenopathy:     Cervical: No cervical adenopathy.  Skin:    General: Skin is warm and dry.  Neurological:     Mental Status: She is alert.     Cranial Nerves: No cranial nerve deficit.  Psychiatric:        Mood and Affect: Mood normal.        Speech: Speech normal.        Behavior: Behavior normal.      UC Treatments / Results  Labs (all labs ordered are listed, but only abnormal results are displayed) Labs Reviewed  POCT INFLUENZA A/B  POCT RAPID STREP A (OFFICE)   Both flu and strep test were negative here.   Medications Ordered in UC Medications - No data to display  Initial Impression / Assessment and Plan / UC Course  I have reviewed the triage vital signs and the nursing notes.  Pertinent labs & imaging results that were available during my care of the patient were reviewed by me and considered in my medical decision making (see chart for details).    Final Clinical Impressions(s) / UC Diagnoses   Final diagnoses:  Pharyngitis, unspecified etiology   Discharge Instructions   None    ED Prescriptions    Medication Sig Dispense Auth. Provider   chlorhexidine (PERIDEX) 0.12 % solution Use as directed 15 mLs in the mouth or throat 2 (two) times daily. 120 mL Elvina Sidle, MD     Controlled Substance Prescriptions [place] Controlled Substance Registry consulted? No the [place] controlled substance Registry was consulted toward the end of the visit and patient's only use of any narcotic was back in 2018 when she was given a cough syrup.   Elvina Sidle, MD 03/19/18 [phone]

## 2018-03-19 NOTE — ED Triage Notes (Signed)
Pt c/o illness that started Tuesday with a sore throat. Developed into head pressure, ear pain and fatigue. Taking dayquil/nyquil prn.

## 2018-03-20 ENCOUNTER — Telehealth: Payer: Self-pay

## 2018-03-20 LAB — STREP A DNA PROBE: Group A Strep Probe: NOT DETECTED

## 2018-03-20 NOTE — Telephone Encounter (Signed)
Given neg tcx results. Advised to f/u with pcp in a week if not improved.

## 2018-05-14 ENCOUNTER — Telehealth: Payer: Self-pay

## 2018-05-14 NOTE — Telephone Encounter (Signed)
Patient called and reports distant contact with someone who has been tested for the COVID-19. Results are not back at the moment. Patient has no symptoms at the moment. Patient has self quarantined for the last few day. If symptoms such as cough, shortness of breath and fever and test comes back positive go to [email or web address] to do an e-visit. You will be directed to next steps.    10 minutes was spent with patient on the phone educating about the next steps and how to properly self quarantine.

## 2018-06-19 DIAGNOSIS — Z3481 Encounter for supervision of other normal pregnancy, first trimester: Secondary | ICD-10-CM | POA: Diagnosis not present

## 2018-06-23 DIAGNOSIS — Z3481 Encounter for supervision of other normal pregnancy, first trimester: Secondary | ICD-10-CM | POA: Diagnosis not present

## 2018-07-08 DIAGNOSIS — Z3481 Encounter for supervision of other normal pregnancy, first trimester: Secondary | ICD-10-CM | POA: Diagnosis not present

## 2018-07-08 DIAGNOSIS — Z118 Encounter for screening for other infectious and parasitic diseases: Secondary | ICD-10-CM | POA: Diagnosis not present

## 2018-07-08 DIAGNOSIS — Z113 Encounter for screening for infections with a predominantly sexual mode of transmission: Secondary | ICD-10-CM | POA: Diagnosis not present

## 2018-07-23 DIAGNOSIS — Z3687 Encounter for antenatal screening for uncertain dates: Secondary | ICD-10-CM | POA: Diagnosis not present

## 2018-08-06 DIAGNOSIS — Z3682 Encounter for antenatal screening for nuchal translucency: Secondary | ICD-10-CM | POA: Diagnosis not present

## 2018-08-06 DIAGNOSIS — Z36 Encounter for antenatal screening for chromosomal anomalies: Secondary | ICD-10-CM | POA: Diagnosis not present

## 2018-09-17 DIAGNOSIS — Z363 Encounter for antenatal screening for malformations: Secondary | ICD-10-CM | POA: Diagnosis not present

## 2018-11-12 DIAGNOSIS — O99012 Anemia complicating pregnancy, second trimester: Secondary | ICD-10-CM | POA: Diagnosis not present

## 2018-11-12 DIAGNOSIS — Z3493 Encounter for supervision of normal pregnancy, unspecified, third trimester: Secondary | ICD-10-CM | POA: Diagnosis not present

## 2018-11-12 DIAGNOSIS — Z23 Encounter for immunization: Secondary | ICD-10-CM | POA: Diagnosis not present

## 2018-11-18 DIAGNOSIS — L03012 Cellulitis of left finger: Secondary | ICD-10-CM | POA: Diagnosis not present

## 2018-12-01 DIAGNOSIS — O360931 Maternal care for other rhesus isoimmunization, third trimester, fetus 1: Secondary | ICD-10-CM | POA: Diagnosis not present

## 2018-12-01 DIAGNOSIS — Z3490 Encounter for supervision of normal pregnancy, unspecified, unspecified trimester: Secondary | ICD-10-CM | POA: Diagnosis not present

## 2019-01-11 DIAGNOSIS — N889 Noninflammatory disorder of cervix uteri, unspecified: Secondary | ICD-10-CM | POA: Diagnosis not present

## 2019-01-11 DIAGNOSIS — Z3483 Encounter for supervision of other normal pregnancy, third trimester: Secondary | ICD-10-CM | POA: Diagnosis not present

## 2019-01-11 DIAGNOSIS — Z3685 Encounter for antenatal screening for Streptococcus B: Secondary | ICD-10-CM | POA: Diagnosis not present

## 2019-01-28 DIAGNOSIS — O26843 Uterine size-date discrepancy, third trimester: Secondary | ICD-10-CM | POA: Diagnosis not present

## 2019-02-04 DIAGNOSIS — Z3483 Encounter for supervision of other normal pregnancy, third trimester: Secondary | ICD-10-CM | POA: Diagnosis not present

## 2019-02-04 DIAGNOSIS — O99344 Other mental disorders complicating childbirth: Secondary | ICD-10-CM | POA: Diagnosis not present

## 2019-02-04 DIAGNOSIS — R1084 Generalized abdominal pain: Secondary | ICD-10-CM | POA: Diagnosis not present

## 2019-02-04 DIAGNOSIS — Z79899 Other long term (current) drug therapy: Secondary | ICD-10-CM | POA: Diagnosis not present

## 2019-02-04 DIAGNOSIS — O26893 Other specified pregnancy related conditions, third trimester: Secondary | ICD-10-CM | POA: Diagnosis not present

## 2019-02-04 DIAGNOSIS — Z3A39 39 weeks gestation of pregnancy: Secondary | ICD-10-CM | POA: Diagnosis not present

## 2019-02-04 DIAGNOSIS — F329 Major depressive disorder, single episode, unspecified: Secondary | ICD-10-CM | POA: Diagnosis not present

## 2019-02-04 DIAGNOSIS — Z87891 Personal history of nicotine dependence: Secondary | ICD-10-CM | POA: Diagnosis not present

## 2019-02-04 DIAGNOSIS — Z20828 Contact with and (suspected) exposure to other viral communicable diseases: Secondary | ICD-10-CM | POA: Diagnosis not present

## 2019-02-22 DIAGNOSIS — N390 Urinary tract infection, site not specified: Secondary | ICD-10-CM | POA: Diagnosis not present

## 2019-03-11 ENCOUNTER — Ambulatory Visit (INDEPENDENT_AMBULATORY_CARE_PROVIDER_SITE_OTHER): Payer: Medicaid Other | Admitting: Nurse Practitioner

## 2019-03-11 ENCOUNTER — Encounter: Payer: Self-pay | Admitting: Nurse Practitioner

## 2019-03-11 DIAGNOSIS — J36 Peritonsillar abscess: Secondary | ICD-10-CM | POA: Diagnosis not present

## 2019-03-11 MED ORDER — LIDOCAINE VISCOUS HCL 2 % MT SOLN: 15.0000 mL | OROMUCOSAL | 0 refills | Status: AC | PRN

## 2019-03-11 MED ORDER — AMOXICILLIN-POT CLAVULANATE 875-125 MG PO TABS: 1.0000 | ORAL_TABLET | Freq: Two times a day (BID) | ORAL | 0 refills | Status: AC

## 2019-03-11 MED ORDER — FLUCONAZOLE 150 MG PO TABS: 150.0000 mg | ORAL_TABLET | Freq: Once | ORAL | 0 refills | Status: AC

## 2019-03-11 MED ORDER — IBUPROFEN 800 MG PO TABS: 800.0000 mg | ORAL_TABLET | Freq: Four times a day (QID) | ORAL | 0 refills | Status: DC | PRN

## 2019-03-11 NOTE — Progress Notes (Addendum)
Virtual Visit via Video Note  I connected with Kelli Gill on 03/11/19 at  8:10 AM EST by the video enabled telemedicine application, Doximity, and verified that I am speaking with the correct person using two identifiers.   I discussed the limitations of evaluation and management by telemedicine and the availability of in person appointments. The patient expressed understanding and agreed to proceed.  Subjective:    CC: Left sided sore throat  HPI: Kelli Gill is a 24 y.o. y/o female presenting via Doximity today for unilateral sore throat (left side) with tonsillar edema and small patch of white exudate on the medial/posterior aspect of the tonsil. Pain started yesterday suddenly and is a 10/10. She had a sinus infection in December and was given amoxicillin for treatment for this. She delivered her baby with one day of antibiotics remaining and therefore did not complete the last 2 doses. She did take the two remaining doses of amoxicillin yesterday when her symptoms started.    UPPER RESPIRATORY TRACT INFECTION Worst symptom: sore throat Fever: no Cough: no Shortness of breath: no Wheezing: no Chest pain: yes, with cough Chest tightness: no Chest congestion: no Nasal congestion: no Runny nose: no Post nasal drip: no Sneezing: no Sore throat: yes Swollen glands: yes Sinus pressure: no Headache: no Face pain: no Toothache: no Ear pain: no  Ear pressure: no  Eyes red/itching:no Eye drainage/crusting: no  Vomiting: no Rash: no Fatigue: no Sick contacts: unknown Strep contacts: unknown  Context: worse Recurrent sinusitis: no Relief with OTC cold/cough medications: no  Treatments attempted: Tylenol  Drooling: no Swollen neck: yes Stiff neck: "a little"   Past medical history, Surgical history, Family history not pertinant except as noted below, Social history, Allergies, and medications have been entered into the medical record, reviewed, and corrections made.    Review of Systems:  General: No fevers, chills, night sweats, weight loss.   Neuro: No headache, numbness, paresthesias, or change in mental status  HEENT: No sinus pain/pressure, ear pain/pressure, vision changes, rhinorrhea, epistaxis, loss of taste, loss of smell, trismus Pulmonary/CV: No cough, shortness of breath, chest pain, edema, palpitations, syncope GI: No abdominal pain, nausea, vomiting, diarrhea, changes in bowel habits, anorexia  Skin:No rash, color changes No new sexual encounters- 1 month post partum  Objective:    General: Speaking clearly in complete sentences without any shortness of breath.   Alert and oriented x3.   Normal judgment.  No apparent acute distress.  No muffled voice, no drooling present. Swallowing well.  Left sided 2+ tonsillar hypertrophy noted on video exam. Significant erythema noted on the left side only. No deviation of uvula noted. No bulging of the soft palate noted.   Visibility limited due to platform.  Impression and Recommendations:    1. Peritonsillar cellulitis Presentation and symptoms strongly suggest peritonsillar cellulitis. Instructions provided on symptoms that would suggest graduation to abscess and when to seek emergency care. Strep test not performed today due to virtual visit platform and patient safety considerations with newborn. Pt to call pediatrician for recommendations surrounding care for the infant. Script sent for antibiotic and pain management. Will follow-up with patient tomorrow. Pt to RTC if symptoms fail to improve.    - amoxicillin-clavulanate (AUGMENTIN) 875-125 MG tablet; Take 1 tablet by mouth 2 (two) times daily for 14 days.  Dispense: 28 tablet; Refill: 0 - fluconazole (DIFLUCAN) 150 MG tablet; Take 1 tablet (150 mg total) by mouth once for 1 dose.  Dispense: 1 tablet; Refill: 0 -  lidocaine (XYLOCAINE) 2 % solution; Use as directed 15 mLs in the mouth or throat every 3 (three) hours as needed for up to 5 days  (mouth/throat pain - gargle and spit).  Dispense: 15 mL; Refill: 0 - ibuprofen (ADVIL) 800 MG tablet; Take 1 tablet (800 mg total) by mouth every 6 (six) hours as needed for moderate pain.  Dispense: 30 tablet; Refill: 0   I discussed the assessment and treatment plan with the patient. The patient was provided an opportunity to ask questions and all were answered. The patient agreed with the plan and demonstrated an understanding of the instructions.   The patient was advised to call back or seek an in-person evaluation if the symptoms worsen or if the condition fails to improve as anticipated.    Tollie Eth, NP

## 2019-03-11 NOTE — Patient Instructions (Signed)
If your sore throat worsens, or if you develop a fever, drooling, muffled voice, swelling in your neck, or are unable to swallow, seek emergency medical care.   Send me a Pharmacist, community message tomorrow and let me know how you are feeling before we head into the weekend.    Peritonsillar Cellulitis  Peritonsillar cellulitis is an infection of the tissue around a tonsil that results in a severe sore throat. This infection can be treated with antibiotic medicine. If it is not treated, a collection of pus can form in the throat (peritonsillar abscess), which may require drainage. What are the causes? This condition is usually caused by a combination of several types of bacteria. You may get infected by:  Breathing in droplets from an infected person's cough or sneeze.  Touching something that has been exposed to the bacteria (has been contaminated) and then touching your mouth, nose, or eyes. What increases the risk? This condition is more likely to develop in people who:  Have frequent tonsil infections.  Have gum disease or a dental infection.  Smoke. What are the signs or symptoms? Kelli Gill symptoms of this condition include:  Fever and chills.  Soreness on one side of the throat.  Pain in one ear.  Pain when swallowing.  Tiredness. As the infection gets worse, symptoms may include:  Severe pain when swallowing.  Drooling.  Trouble opening the mouth wide.  Bad breath.  Changes in how the voice sounds, such as hoarseness. How is this diagnosed? This condition may be diagnosed based on:  Your symptoms.  A physical exam.  Testing a fluid sample from your throat (throat culture). This helps determine which types of bacteria are causing your infection.  A blood test. How is this treated? This condition is usually treated with antibiotic medicines. You may need to take these medicines by mouth or through an IV at the hospital. Treatment may also include:  Medicines for pain,  fever, or swelling. Some of these medicines may be given through an IV.  Draining a peritonsillar abscess, if you have one. Your health care provider may drain the abscess by using a needle or by making an incision in the abscess.  Surgery to remove the tonsils (tonsillectomy). This may be done if you get peritonsillar cellulitis often. Follow these instructions at home: Eating and drinking      If it is difficult or painful to swallow, try only drinking liquids or only eating soft foods until you feel better.  Drink enough fluid to keep your urine pale yellow. Medicines  If you were prescribed an antibiotic to take at home, take it as told by your health care provider. Do not stop taking the antibiotic even if you start to feel better.  Take other over-the-counter and prescription medicines only as told by your health care provider. Your health care provider may recommend taking over-the-counter medicines to relieve pain, a fever, or swelling. Activity  Rest and get plenty of sleep.  Return to your normal activities, including school or work, as told by your health care provider. General instructions  Do not use any products that contain nicotine or tobacco, such as cigarettes and e-cigarettes. If you need help quitting, ask your health care provider.  To help ease pain and swelling, gargle with a salt-water mixture 3-4 times a day or as needed. To make a salt-water mixture, completely dissolve -1 tsp of salt in 1 cup of warm water.  Keep all follow-up visits as told by your health  care provider. This is important. Contact a health care provider if:  You have pain or swelling that gets worse.  You develop difficulty swallowing.  You have a fever that does not improve after you take medicine.  Your voice changes.  You see pus around or near your tonsils. This may look like yellowish-white fluid. Get help right away if:  You have trouble breathing.  You have severe  pain.  You cough up bloody spit.  You are unable to swallow.  You cannot stop drooling. Summary  Peritonsillar cellulitis is an infection of the tissue around a tonsil that results in a severe sore throat. This is treated with antibiotics. If the condition is not treated, a collection of pus can form in the throat (peritonsillar abscess), which may need to be drained.  This condition is usually treated with antibiotic medicines. You may need to take these medicines by mouth or through an IV at the hospital.  Rest and get plenty of sleep. Return to your normal activities as told by your health care provider. This information is not intended to replace advice given to you by your health care provider. Make sure you discuss any questions you have with your health care provider. Document Revised: 05/12/2017 Document Reviewed: 11/12/2016 Elsevier Patient Education  2020 [employer].

## 2019-03-26 ENCOUNTER — Telehealth (INDEPENDENT_AMBULATORY_CARE_PROVIDER_SITE_OTHER): Payer: Medicaid Other | Admitting: Family Medicine

## 2019-03-26 DIAGNOSIS — J069 Acute upper respiratory infection, unspecified: Secondary | ICD-10-CM | POA: Diagnosis not present

## 2019-03-26 DIAGNOSIS — R432 Parageusia: Secondary | ICD-10-CM

## 2019-03-26 DIAGNOSIS — Z20822 Contact with and (suspected) exposure to covid-19: Secondary | ICD-10-CM

## 2019-03-26 NOTE — Addendum Note (Signed)
Addended by: Jed Limerick on: 03/26/2019 03:24 PM   Modules accepted: Orders

## 2019-03-26 NOTE — Progress Notes (Signed)
Virtual Visit via Telephone Note  I connected with Kelli Gill on 03/26/19 at  1:20 PM EST by a video enabled telemedicine application and verified that I am speaking with the correct person using two identifiers.   I discussed the limitations of evaluation and management by telemedicine and the availability of in person appointments. The patient expressed understanding and agreed to proceed.  Subjective:    CC: COVID  HPI: 24 yo female was around her mom last Tuesday ( 8 days ago)  who tested positive for COVID. She has some mild mild congestion, sneezing and runny nose.  + watery itchy eyes. For a couple of days but then woke up with loss of her taste and smell this morning this morning.  No fever, chills or sweats. She has a 15 week old.  No GI sxs.  No medications.  husband and kids are fine so far.     Past medical history, Surgical history, Family history not pertinant except as noted below, Social history, Allergies, and medications have been entered into the medical record, reviewed, and corrections made.   Review of Systems: No fevers, chills, night sweats, weight loss, chest pain, or shortness of breath.   Objective:    General: Speaking clearly in complete sentences without any shortness of breath.  Alert and oriented x3.  Normal judgment. No apparent acute distress.    Impression and Recommendations:    URI - COVID-19 suspect. Will swab today. Discussed quarantining for min of 10 days. Ok to use tylenol and IBU.  sxs seem mild thus far. Discussed precautions with her family and children.    Send text to sign of for MyChart.     I discussed the assessment and treatment plan with the patient. The patient was provided an opportunity to ask questions and all were answered. The patient agreed with the plan and demonstrated an understanding of the instructions.   The patient was advised to call back or seek an in-person evaluation if the symptoms worsen or if the condition  fails to improve as anticipated.   Nani Gasser, MD

## 2019-03-28 LAB — NOVEL CORONAVIRUS, NAA: SARS-CoV-2, NAA: DETECTED — AB

## 2019-04-11 DIAGNOSIS — N390 Urinary tract infection, site not specified: Secondary | ICD-10-CM | POA: Diagnosis not present

## 2019-07-20 DIAGNOSIS — H52223 Regular astigmatism, bilateral: Secondary | ICD-10-CM | POA: Diagnosis not present

## 2019-09-14 ENCOUNTER — Telehealth (INDEPENDENT_AMBULATORY_CARE_PROVIDER_SITE_OTHER): Payer: Medicaid Other | Admitting: Family Medicine

## 2019-09-15 NOTE — Progress Notes (Signed)
Canceled bc unsure or insurance coverage.

## 2019-11-03 ENCOUNTER — Telehealth (INDEPENDENT_AMBULATORY_CARE_PROVIDER_SITE_OTHER): Payer: Medicaid Other | Admitting: Family Medicine

## 2019-11-03 ENCOUNTER — Encounter: Payer: Self-pay | Admitting: Family Medicine

## 2019-11-03 DIAGNOSIS — R197 Diarrhea, unspecified: Secondary | ICD-10-CM

## 2019-11-03 DIAGNOSIS — R0989 Other specified symptoms and signs involving the circulatory and respiratory systems: Secondary | ICD-10-CM

## 2019-11-03 NOTE — Assessment & Plan Note (Signed)
Some body aches and diarrhea recently.  Symptoms resolved at this point.  Discussed possibility this may have been due to COVID especially with family members testing positive..  >10 days since onset and symptoms resolved at this point so I don't think testing is necessary at this time.

## 2019-11-03 NOTE — Progress Notes (Signed)
Husband and 65 month old recovering from COVID.  She isolated.  Sleeping on throw pillows and had neck pain.  Lump in throat upon swallowing. Jaw pain that radiated to neck (front then back). Then it disappeared.  Lower back pain and diarrhea x 2 days. Then it also disappeared.  No symptoms today.  She looked up weird signs of COVID.  No COVID vaccine.   Had COVID Jan. 31, 2021

## 2019-11-03 NOTE — Assessment & Plan Note (Signed)
This has resolved.  Denies heartburn issues, lymph node enlargement.  Discussed that if symptoms return I would recommend evaluation in person.

## 2019-11-03 NOTE — Progress Notes (Signed)
Kelli Gill - 24 y.o. female MRN [id number]  Date of birth: 01-03-1996   This visit type was conducted due to national recommendations for restrictions regarding the COVID-19 Pandemic (e.g. social distancing).  This format is felt to be most appropriate for this patient at this time.  All issues noted in this document were discussed and addressed.  No physical exam was performed (except for noted visual exam findings with Video Visits).  I discussed the limitations of evaluation and management by telemedicine and the availability of in person appointments. The patient expressed understanding and agreed to proceed.  I connected with@ on 11/03/19 at  3:00 PM EDT by a video enabled telemedicine application and verified that I am speaking with the correct person using two identifiers.  Present at visit: Everrett Coombe, DO Davelyn Elsbernd   Patient Location: Home [address] [place] [address]   Provider location:   [facility]  Chief Complaint  Patient presents with  . Back Pain  . Possible COVID    HPI  Kelli Gill is a 24 y.o. female who presents via Web designer for a telehealth visit today.  She reports symptoms of low back pain, neck pain and diarrhea that lasted for a couple of days.  She also had sensation of lump in her throat, felt like she swallowed a golf ball.  Her symptoms have completely resolved at this point.  She does report that her husband recently tested + for COVID-19.  She did isolate herself from the rest of her family as much as possible.  She denies fever, chills, cough, shortness of breath, nausea or vomiting, lymph node enlargement.     ROS:  A comprehensive ROS was completed and negative except as noted per HPI  Past Medical History:  Diagnosis Date  . Depression   . GERD (gastroesophageal reflux disease)     Past Surgical History:  Procedure Laterality Date  . WISDOM TOOTH EXTRACTION      Family History  Problem Relation Age  of Onset  . Diabetes Mother   . Hypertension Father   . Hyperlipidemia Father   . Diabetes Maternal Grandfather     Social History   Socioeconomic History  . Marital status: Single    Spouse name: Not on file  . Number of children: Not on file  . Years of education: Not on file  . Highest education level: Not on file  Occupational History  . Not on file  Tobacco Use  . Smoking status: Never Smoker  . Smokeless tobacco: Never Used  Vaping Use  . Vaping Use: Never used  Substance and Sexual Activity  . Alcohol use: No  . Drug use: No  . Sexual activity: Yes    Birth control/protection: Pill  Other Topics Concern  . Not on file  Social History Narrative  . Not on file   Social Determinants of Health   Financial Resource Strain:   . Difficulty of Paying Living Expenses: Not on file  Food Insecurity:   . Worried About Programme researcher, broadcasting/film/video in the Last Year: Not on file  . Ran Out of Food in the Last Year: Not on file  Transportation Needs:   . Lack of Transportation (Medical): Not on file  . Lack of Transportation (Non-Medical): Not on file  Physical Activity:   . Days of Exercise per Week: Not on file  . Minutes of Exercise per Session: Not on file  Stress:   . Feeling of Stress :  Not on file  Social Connections:   . Frequency of Communication with Friends and Family: Not on file  . Frequency of Social Gatherings with Friends and Family: Not on file  . Attends Religious Services: Not on file  . Active Member of Clubs or Organizations: Not on file  . Attends Banker Meetings: Not on file  . Marital Status: Not on file  Intimate Partner Violence:   . Fear of Current or Ex-Partner: Not on file  . Emotionally Abused: Not on file  . Physically Abused: Not on file  . Sexually Abused: Not on file     Current Outpatient Medications:  Marland Kitchen  KAITLIB FE 0.8-25 MG-MCG tablet, Chew 0.8-25 mcg by mouth daily., Disp: , Rfl:   EXAM:  VITALS per patient if  applicable: Wt 185 lb (83.9 kg)   BMI 29.86 kg/m   GENERAL: alert, oriented, appears well and in no acute distress  HEENT: atraumatic, conjunttiva clear, no obvious abnormalities on inspection of external nose and ears  NECK: normal movements of the head and neck  LUNGS: on inspection no signs of respiratory distress, breathing rate appears normal, no obvious gross SOB, gasping or wheezing  CV: no obvious cyanosis  MS: moves all visible extremities without noticeable abnormality  PSYCH/NEURO: pleasant and cooperative, no obvious depression or anxiety, speech and thought processing grossly intact  ASSESSMENT AND PLAN:  Discussed the following assessment and plan:  Globus sensation This has resolved.  Denies heartburn issues, lymph node enlargement.  Discussed that if symptoms return I would recommend evaluation in person.    Diarrhea Some body aches and diarrhea recently.  Symptoms resolved at this point.  Discussed possibility this may have been due to COVID especially with family members testing positive..  >10 days since onset and symptoms resolved at this point so I don't think testing is necessary at this time.      I discussed the assessment and treatment plan with the patient. The patient was provided an opportunity to ask questions and all were answered. The patient agreed with the plan and demonstrated an understanding of the instructions.   The patient was advised to call back or seek an in-person evaluation if the symptoms worsen or if the condition fails to improve as anticipated.    Everrett Coombe, DO

## 2019-11-05 DIAGNOSIS — J029 Acute pharyngitis, unspecified: Secondary | ICD-10-CM | POA: Diagnosis not present

## 2019-11-05 DIAGNOSIS — L049 Acute lymphadenitis, unspecified: Secondary | ICD-10-CM | POA: Diagnosis not present

## 2019-11-05 DIAGNOSIS — Z20822 Contact with and (suspected) exposure to covid-19: Secondary | ICD-10-CM | POA: Diagnosis not present

## 2019-11-08 ENCOUNTER — Ambulatory Visit (INDEPENDENT_AMBULATORY_CARE_PROVIDER_SITE_OTHER): Payer: Medicaid Other | Admitting: Family Medicine

## 2019-11-08 ENCOUNTER — Encounter: Payer: Self-pay | Admitting: Family Medicine

## 2019-11-08 VITALS — BP 138/75 | HR 91 | Wt 193.8 lb

## 2019-11-08 DIAGNOSIS — R0989 Other specified symptoms and signs involving the circulatory and respiratory systems: Secondary | ICD-10-CM

## 2019-11-08 DIAGNOSIS — R590 Localized enlarged lymph nodes: Secondary | ICD-10-CM | POA: Diagnosis not present

## 2019-11-08 DIAGNOSIS — F419 Anxiety disorder, unspecified: Secondary | ICD-10-CM

## 2019-11-08 DIAGNOSIS — F329 Major depressive disorder, single episode, unspecified: Secondary | ICD-10-CM

## 2019-11-08 MED ORDER — ESCITALOPRAM OXALATE 10 MG PO TABS: ORAL_TABLET | ORAL | 1 refills | Status: DC

## 2019-11-08 NOTE — Patient Instructions (Addendum)
Nice to see you today! Have labs completed today.  You will be contacted to set up thyroid US.  Follow up in about 4-6 weeks for anxiety.      Managing Anxiety, Adult After being diagnosed with an anxiety disorder, you may be relieved to know why you have felt or behaved a certain way. You may also feel overwhelmed about the treatment ahead and what it will mean for your life. With care and support, you can manage this condition and recover from it. How to manage lifestyle changes Managing stress and anxiety  Stress is your body's reaction to life changes and events, both good and bad. Most stress will last just a few hours, but stress can be ongoing and can lead to more than just stress. Although stress can play a major role in anxiety, it is not the same as anxiety. Stress is usually caused by something external, such as a deadline, test, or competition. Stress normally passes after the triggering event has ended.  Anxiety is caused by something internal, such as imagining a terrible outcome or worrying that something will go wrong that will devastate you. Anxiety often does not go away even after the triggering event is over, and it can become long-term (chronic) worry. It is important to understand the differences between stress and anxiety and to manage your stress effectively so that it does not lead to an anxious response. Talk with your health care provider or a counselor to learn more about reducing anxiety and stress. He or she may suggest tension reduction techniques, such as:  Music therapy. This can include creating or listening to music that you enjoy and that inspires you.  Mindfulness-based meditation. This involves being aware of your normal breaths while not trying to control your breathing. It can be done while sitting or walking.  Centering prayer. This involves focusing on a word, phrase, or sacred image that means something to you and brings you peace.  Deep breathing. To  do this, expand your stomach and inhale slowly through your nose. Hold your breath for 3-5 seconds. Then exhale slowly, letting your stomach muscles relax.  Self-talk. This involves identifying thought patterns that lead to anxiety reactions and changing those patterns.  Muscle relaxation. This involves tensing muscles and then relaxing them. Choose a tension reduction technique that suits your lifestyle and personality. These techniques take time and practice. Set aside 5-15 minutes a day to do them. Therapists can offer counseling and training in these techniques. The training to help with anxiety may be covered by some insurance plans. Other things you can do to manage stress and anxiety include:  Keeping a stress/anxiety diary. This can help you learn what triggers your reaction and then learn ways to manage your response.  Thinking about how you react to certain situations. You may not be able to control everything, but you can control your response.  Making time for activities that help you relax and not feeling guilty about spending your time in this way.  Visual imagery and yoga can help you stay calm and relax.  Medicines Medicines can help ease symptoms. Medicines for anxiety include:  Anti-anxiety drugs.  Antidepressants. Medicines are often used as a primary treatment for anxiety disorder. Medicines will be prescribed by a health care provider. When used together, medicines, psychotherapy, and tension reduction techniques may be the most effective treatment. Relationships Relationships can play a big part in helping you recover. Try to spend more time connecting with trusted friends  and family members. Consider going to couples counseling, taking family education classes, or going to family therapy. Therapy can help you and others better understand your condition. How to recognize changes in your anxiety Everyone responds differently to treatment for anxiety. Recovery from  anxiety happens when symptoms decrease and stop interfering with your daily activities at home or work. This may mean that you will start to:  Have better concentration and focus. Worry will interfere less in your daily thinking.  Sleep better.  Be less irritable.  Have more energy.  Have improved memory. It is important to recognize when your condition is getting worse. Contact your health care provider if your symptoms interfere with home or work and you feel like your condition is not improving. Follow these instructions at home: Activity  Exercise. Most adults should do the following: ? Exercise for at least 150 minutes each week. The exercise should increase your heart rate and make you sweat (moderate-intensity exercise). ? Strengthening exercises at least twice a week.  Get the right amount and quality of sleep. Most adults need 7-9 hours of sleep each night. Lifestyle   Eat a healthy diet that includes plenty of vegetables, fruits, whole grains, low-fat dairy products, and lean protein. Do not eat a lot of foods that are high in solid fats, added sugars, or salt.  Make choices that simplify your life.  Do not use any products that contain nicotine or tobacco, such as cigarettes, e-cigarettes, and chewing tobacco. If you need help quitting, ask your health care provider.  Avoid caffeine, alcohol, and certain over-the-counter cold medicines. These may make you feel worse. Ask your pharmacist which medicines to avoid. General instructions  Take over-the-counter and prescription medicines only as told by your health care provider.  Keep all follow-up visits as told by your health care provider. This is important. Where to find support You can get help and support from these sources:  Self-help groups.  Online and [employer].  A trusted spiritual leader.  Couples counseling.  Family education classes.  Family therapy. Where to find more  information You may find that joining a support group helps you deal with your anxiety. The following sources can help you locate counselors or support groups near you:  Mental Health America: www.mentalhealthamerica.net  Anxiety and Depression Association of [place] (ADAA): ProgramCam.de  [employer] on Mental Illness (NAMI): www.nami.org Contact a health care provider if you:  Have a hard time staying focused or finishing daily tasks.  Spend many hours a day feeling worried about everyday life.  Become exhausted by worry.  Start to have headaches, feel tense, or have nausea.  Urinate more than normal.  Have diarrhea. Get help right away if you have:  A racing heart and shortness of breath.  Thoughts of hurting yourself or others. If you ever feel like you may hurt yourself or others, or have thoughts about taking your own life, get help right away. You can go to your nearest emergency department or call:  Your local emergency services (911 in the [place]  A suicide crisis helpline, such as the National Suicide Prevention Lifeline at 5174720751. This is open 24 hours a day. Summary  Taking steps to learn and use tension reduction techniques can help calm you and help prevent triggering an anxiety reaction.  When used together, medicines, psychotherapy, and tension reduction techniques may be the most effective treatment.  Family, friends, and partners can play a big part in helping you recover from an  anxiety disorder. This information is not intended to replace advice given to you by your health care provider. Make sure you discuss any questions you have with your health care provider. Document Revised: 07/14/2018 Document Reviewed: 07/14/2018 Elsevier Patient Education  [place].

## 2019-11-09 ENCOUNTER — Ambulatory Visit (INDEPENDENT_AMBULATORY_CARE_PROVIDER_SITE_OTHER): Payer: Medicaid Other

## 2019-11-09 ENCOUNTER — Other Ambulatory Visit: Payer: Self-pay

## 2019-11-09 DIAGNOSIS — R0989 Other specified symptoms and signs involving the circulatory and respiratory systems: Secondary | ICD-10-CM | POA: Diagnosis not present

## 2019-11-09 LAB — CBC WITH DIFFERENTIAL/PLATELET
Absolute Monocytes: 1035 cells/uL — ABNORMAL HIGH (ref 200–950)
Basophils Absolute: 39 cells/uL (ref 0–200)
Basophils Relative: 0.3 %
Eosinophils Absolute: 26 cells/uL (ref 15–500)
Eosinophils Relative: 0.2 %
HCT: 41 % (ref 35.0–45.0)
Hemoglobin: 13.5 g/dL (ref 11.7–15.5)
Lymphs Abs: 2921 cells/uL (ref 850–3900)
MCH: 28.8 pg (ref 27.0–33.0)
MCHC: 32.9 g/dL (ref 32.0–36.0)
MCV: 87.6 fL (ref 80.0–100.0)
MPV: 10.7 fL (ref 7.5–12.5)
Monocytes Relative: 7.9 %
Neutro Abs: 9078 cells/uL — ABNORMAL HIGH (ref 1500–7800)
Neutrophils Relative %: 69.3 %
Platelets: 361 10*3/uL (ref 140–400)
RBC: 4.68 10*6/uL (ref 3.80–5.10)
RDW: 12.2 % (ref 11.0–15.0)
Total Lymphocyte: 22.3 %
WBC: 13.1 10*3/uL — ABNORMAL HIGH (ref 3.8–10.8)

## 2019-11-09 LAB — TSH+FREE T4: TSH W/REFLEX TO FT4: 2.03 mIU/L

## 2019-11-09 IMAGING — US US THYROID
1 series · 14 of 25 positions shown · non-contrast
Comparison: None.

CLINICAL DATA: Other. 24-year-old female with globus sensation,
evaluate for thyroid nodule.

EXAM:
THYROID ULTRASOUND
TECHNIQUE: Ultrasound examination of the thyroid gland and adjacent soft
tissues was performed.

[Series 1: us thyroid · 0.06mm/px · 14 of 27 slices shown]
[im 1/27]
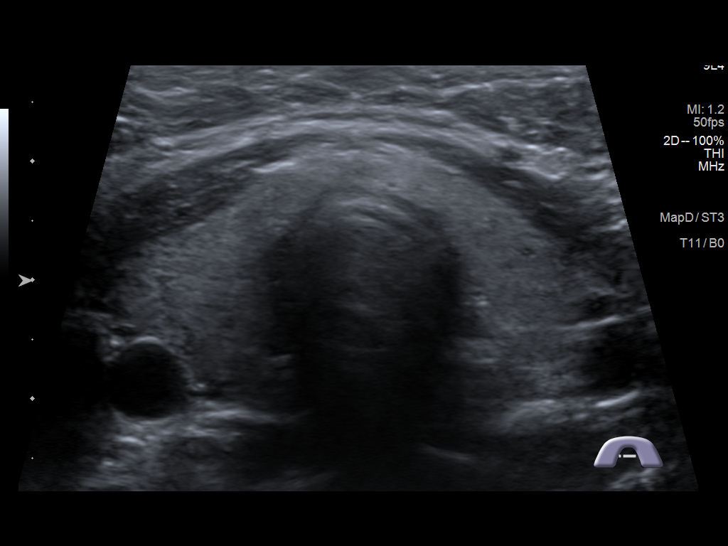
[im 3/27]
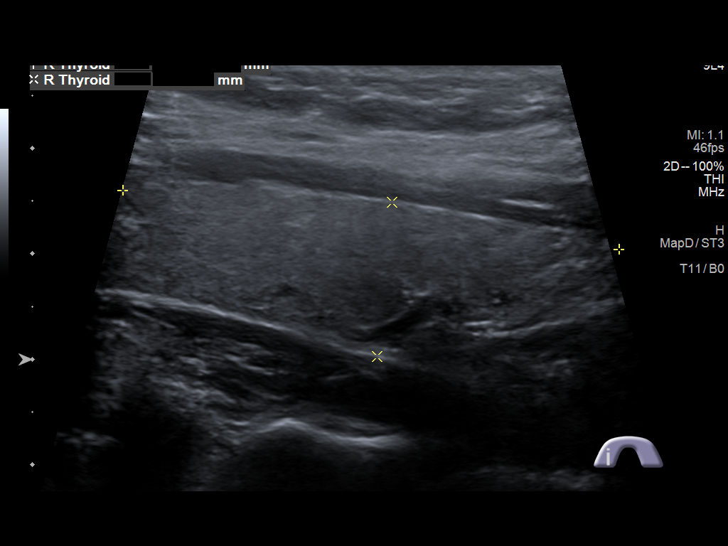
[im 5/27]
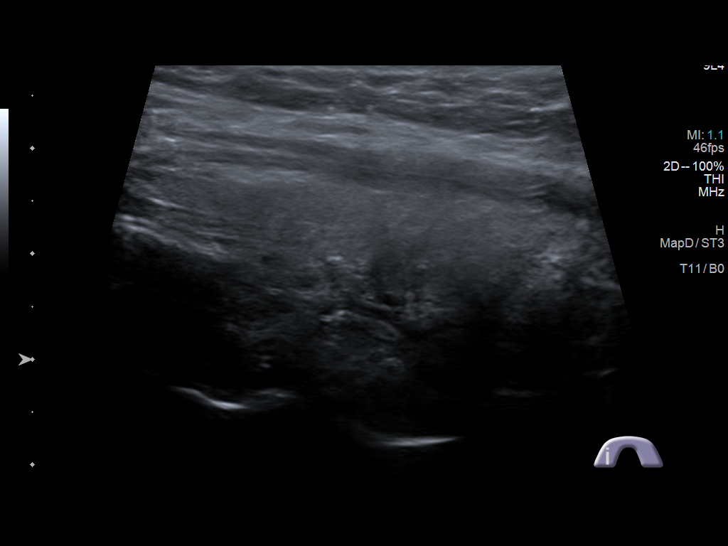
[im 7/27]
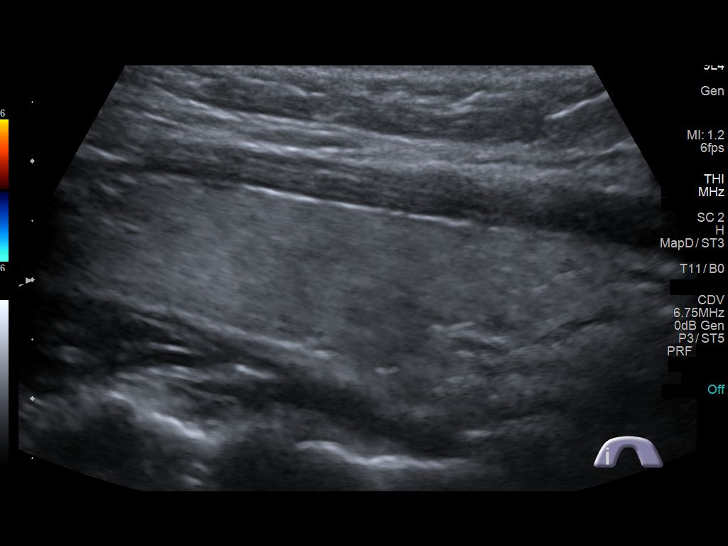
[im 9/27]
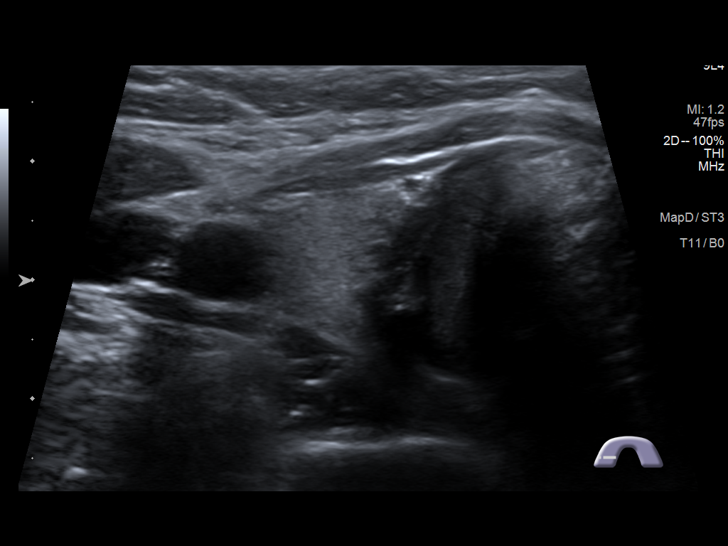
[im 10/27]
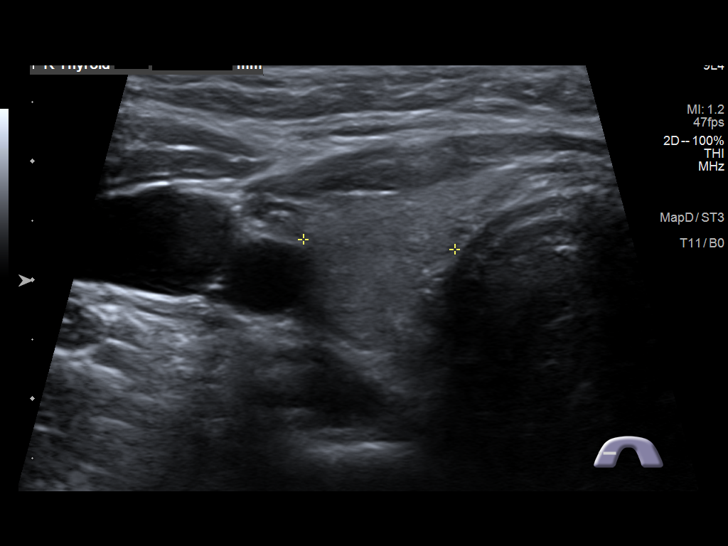
[im 12/27]
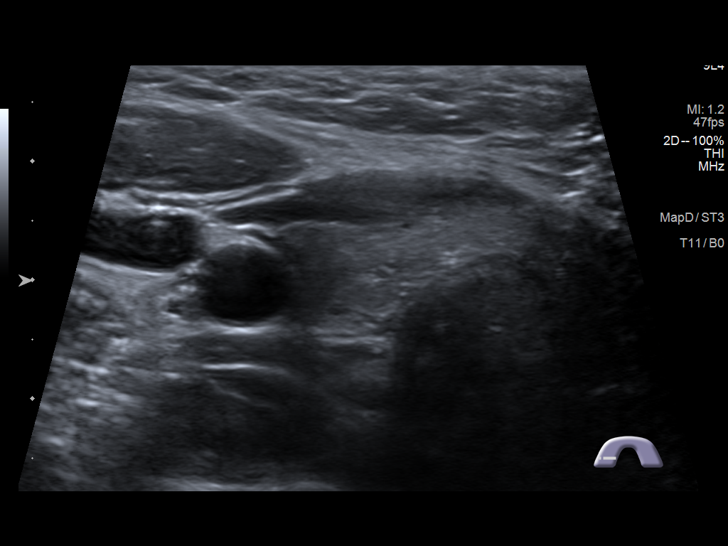
[im 15/27]
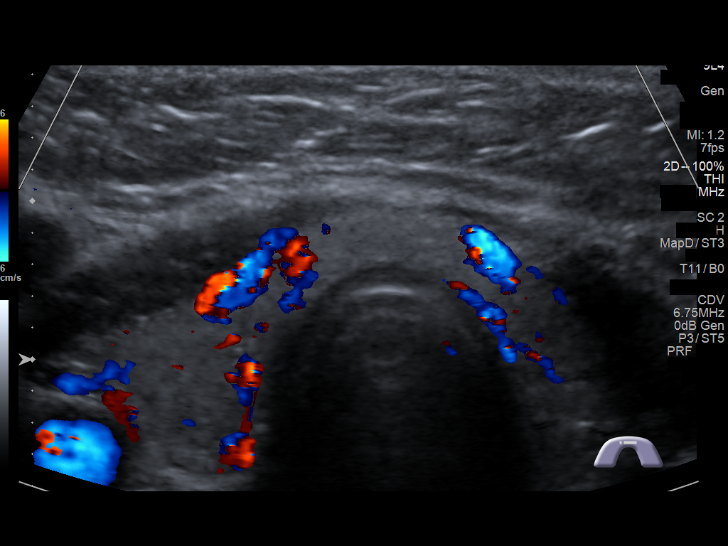
[im 17/27]
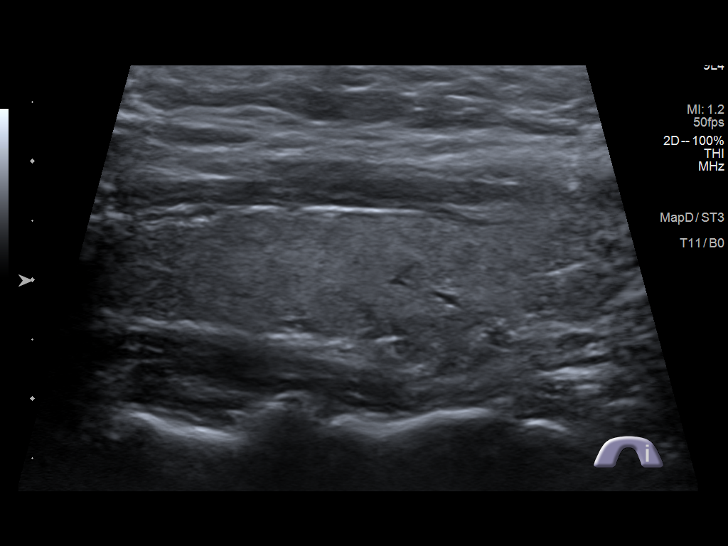
[im 18/27]
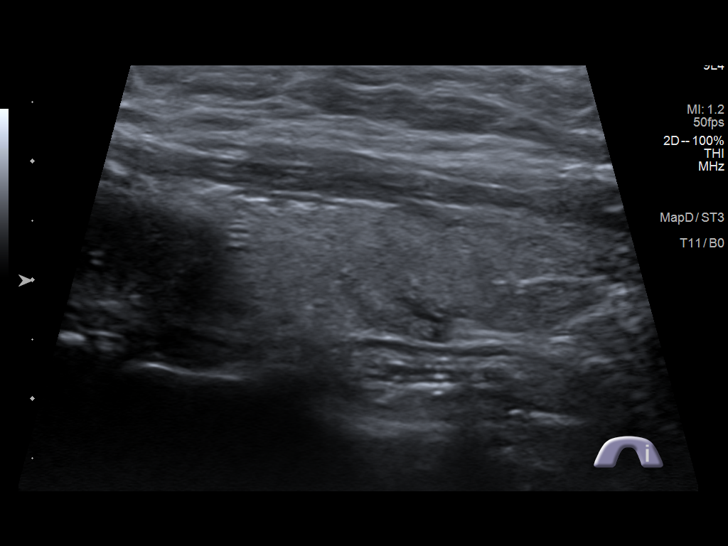
[im 20/27]
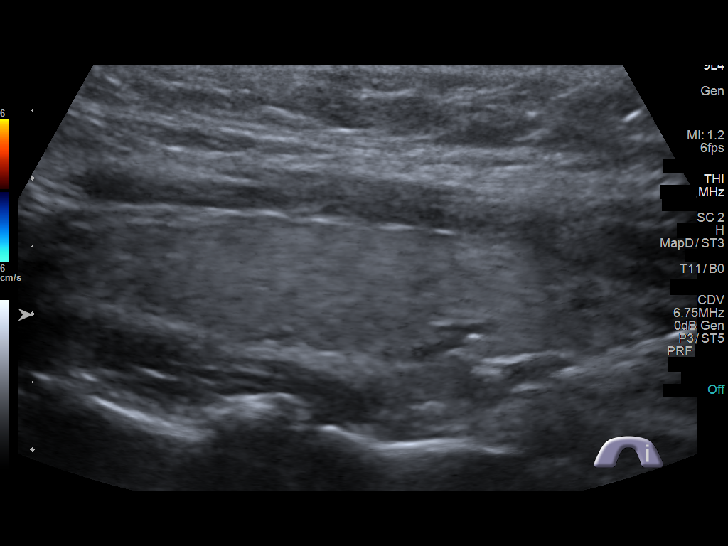
[im 22/27]
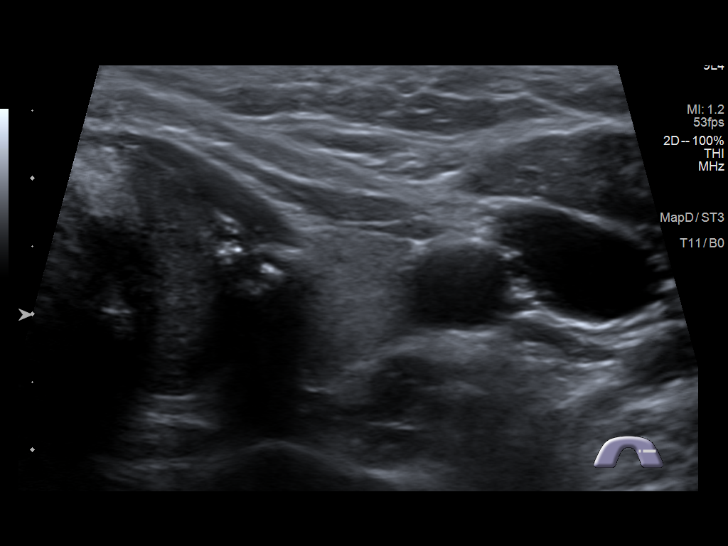
[im 24/27]
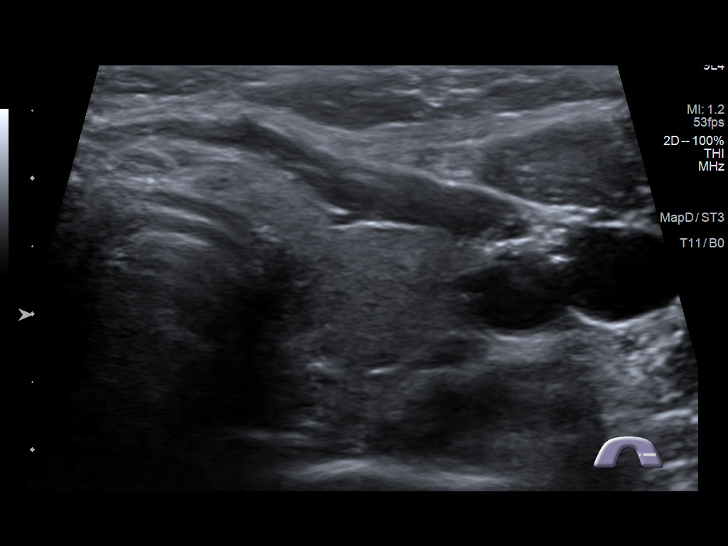
[im 27/27]
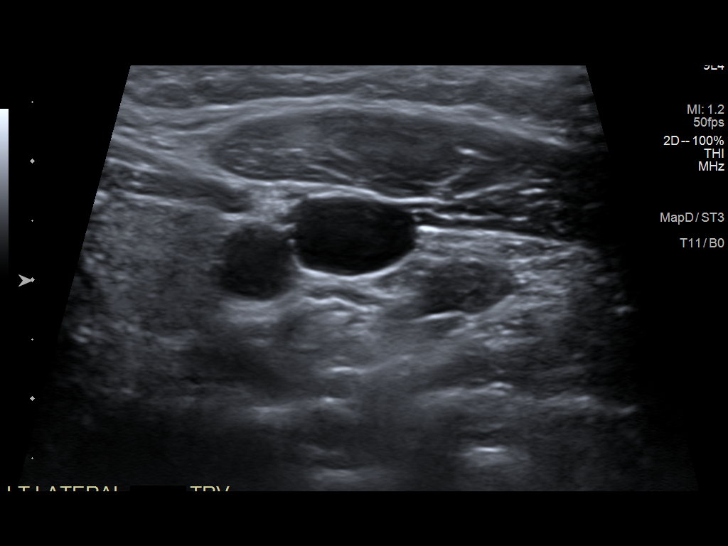

[14 of 25 positions shown; findings below may reference images not displayed]

FINDINGS: Parenchymal Echotexture: Normal

Isthmus: 0.3 cm

Right lobe: 4.7 x 1.5 x 1.3 cm

Left lobe: 0.4 x 1.3 x 1.3 cm

_________________________________________________________

Estimated total number of nodules >/= 1 cm: 0

Number of spongiform nodules >/=  2 cm not described below (TR1): 0

Number of mixed cystic and solid nodules >/= 1.5 cm not described
below (TR2): 0

_________________________________________________________

No discrete nodules are seen within the thyroid gland.
IMPRESSION: Normal appearance of the thyroid gland. No thyroid nodules are
visualized.

## 2019-11-10 ENCOUNTER — Telehealth (INDEPENDENT_AMBULATORY_CARE_PROVIDER_SITE_OTHER): Payer: Medicaid Other | Admitting: Family Medicine

## 2019-11-10 ENCOUNTER — Encounter: Payer: Self-pay | Admitting: Family Medicine

## 2019-11-10 DIAGNOSIS — F419 Anxiety disorder, unspecified: Secondary | ICD-10-CM

## 2019-11-10 DIAGNOSIS — R0989 Other specified symptoms and signs involving the circulatory and respiratory systems: Secondary | ICD-10-CM | POA: Diagnosis not present

## 2019-11-10 DIAGNOSIS — F329 Major depressive disorder, single episode, unspecified: Secondary | ICD-10-CM | POA: Diagnosis not present

## 2019-11-10 MED ORDER — DIAZEPAM 2 MG PO TABS: 2.0000 mg | ORAL_TABLET | Freq: Four times a day (QID) | ORAL | 0 refills | Status: DC | PRN

## 2019-11-10 MED ORDER — SERTRALINE HCL 25 MG PO TABS: ORAL_TABLET | ORAL | 0 refills | Status: DC

## 2019-11-10 NOTE — Progress Notes (Signed)
Kelli Gill - 24 y.o. female MRN [id number]  Date of birth: 17-Oct-1995  Subjective Chief Complaint  Patient presents with  . Oral Swelling    HPI Kelli Gill is a 24 y.o. female here today with complaint of continued globus sensation.  She was seen via VV last week.  She had symptoms of diarrhea and sensation of lump in her throat.  She did have possible exposure to COVID but symptoms had resolved by the time of the VV last week.  She had return of feeling of lump in her throat a couple of days later that has persisted.  She was seen at urgent care and prescribed azithromycin and prednisone as they thought her lymph nodes were enlarged.  This has caused her to become more anxious especially after "googling" her symptoms.  She is concerned that she may have thyroid or throat cancer.  She does admits she has had some increased anxiety with depressive symptoms over the past several months that she thinks may be related to post partum state.    She does report normal appetite.  She does not feel like food gets stuck, denies nausea, fever, increased reflux symptoms or difficulty swallowing.   Depression screen [facility] 2/9 11/08/2019 12/10/2016 11/29/2016  Decreased Interest 1 1 1   Down, Depressed, Hopeless 1 2 1   PHQ - 2 Score 2 3 2   Altered sleeping 0 1 1  Tired, decreased energy 1 2 3   Change in appetite 1 0 0  Feeling bad or failure about yourself  2 1 2   Trouble concentrating 0 0 0  Moving slowly or fidgety/restless 0 0 0  Suicidal thoughts 0 0 0  PHQ-9 Score 6 7 8    GAD 7 : Generalized Anxiety Score 11/08/2019 12/10/2016  Nervous, Anxious, on Edge 3 2  Control/stop worrying 3 2  Worry too much - different things 3 2  Trouble relaxing 3 1  Restless 3 1  Easily annoyed or irritable 3 3  Afraid - awful might happen 3 0  Total GAD 7 Score 21 11  Anxiety Difficulty Somewhat difficult -     ROS:  A comprehensive ROS was completed and negative except as noted per HPI  No Known  Allergies  Past Medical History:  Diagnosis Date  . Depression   . GERD (gastroesophageal reflux disease)     Past Surgical History:  Procedure Laterality Date  . WISDOM TOOTH EXTRACTION      Social History   Socioeconomic History  . Marital status: Single    Spouse name: Not on file  . Number of children: Not on file  . Years of education: Not on file  . Highest education level: Not on file  Occupational History  . Not on file  Tobacco Use  . Smoking status: Never Smoker  . Smokeless tobacco: Never Used  Vaping Use  . Vaping Use: Never used  Substance and Sexual Activity  . Alcohol use: No  . Drug use: No  . Sexual activity: Yes    Birth control/protection: Pill  Other Topics Concern  . Not on file  Social History Narrative  . Not on file   Social Determinants of Health   Financial Resource Strain:   . Difficulty of Paying Living Expenses: Not on file  Food Insecurity:   . Worried About in the Last Year: Not on file  . Ran Out of Food in the Last Year: Not on file  Transportation Needs:   . Lack  of Transportation (Medical): Not on file  . Lack of Transportation (Non-Medical): Not on file  Physical Activity:   . Days of Exercise per Week: Not on file  . Minutes of Exercise per Session: Not on file  Stress:   . Feeling of Stress : Not on file  Social Connections:   . Frequency of Communication with Friends and Family: Not on file  . Frequency of Social Gatherings with Friends and Family: Not on file  . Attends Religious Services: Not on file  . Active Member of Clubs or Organizations: Not on file  . Attends Banker Meetings: Not on file  . Marital Status: Not on file    Family History  Problem Relation Age of Onset  . Diabetes Mother   . Hypertension Father   . Hyperlipidemia Father   . Diabetes Maternal Grandfather     Health Maintenance  Topic Date Due  . Hepatitis C Screening  Never done  . COVID-19 Vaccine  (1) Never done  . INFLUENZA VACCINE  09/26/2019  . PAP-Cervical Cytology Screening  10/28/2020  . PAP SMEAR-Modifier  10/28/2020  . TETANUS/TDAP  02/23/2025  . HIV Screening  Completed     ----------------------------------------------------------------------------------------------------------------------------------------------------------------------------------------------------------------- Physical Exam BP 138/75 (BP Location: Left Arm, Patient Position: Sitting, Cuff Size: Normal)   Pulse 91   Wt 193 lb 12.8 oz (87.9 kg)   SpO2 100%   BMI 31.28 kg/m   Physical Exam Constitutional:      Appearance: Normal appearance.  HENT:     Head: Normocephalic and atraumatic.     Mouth/Throat:     Mouth: Mucous membranes are moist.  Eyes:     General: No scleral icterus. Neck:     Comments: No palpable nodules or goiter noted.  She does have some mildly enlarged anterior cervical nodes.  Mildly tender.  Cardiovascular:     Rate and Rhythm: Normal rate and regular rhythm.  Pulmonary:     Effort: Pulmonary effort is normal.     Breath sounds: Normal breath sounds.  Musculoskeletal:     Cervical back: Neck supple.  Neurological:     General: No focal deficit present.     Mental Status: She is alert.  Psychiatric:     Comments: Tearful and anxious appearing.      ------------------------------------------------------------------------------------------------------------------------------------------------------------------------------------------------------------------- Assessment and Plan  Globus sensation Mild cervical adenopathy, improved since starting antibiotic.  Will have her complete this as well as course of steroid.  No nodules/goiter palpated but will obtain US of thyroid for reassurance.  Check thyroid function tests.  Check CBC with diff today, discussed WBC may be elevated by viral illness and/or steroid use.  We did discuss ENT referral if symptoms persist and  labs/imaging are unremarkable.   Anxiety and depression GAD7 scores fairly high, admits to significant anxiety worsened by recent globus sensation. Discussed that globus sensation may actually be worsened by her anxiety.  Will start lexapro, 5mg  initially then increase to 10mg  daily.  Discussed side effects and delayed effect taking a few weeks to notice improvement with this.     Meds ordered this encounter  Medications  . DISCONTD: escitalopram (LEXAPRO) 10 MG tablet    Sig: Start 1/2 tablet x1 week then increase to a full tablet.    Dispense:  30 tablet    Refill:  1    Return in about 6 weeks (around 12/20/2019) for Anxiety.    This visit occurred during the SARS-CoV-2 public health emergency.  Safety protocols were  in place, including screening questions prior to the visit, additional usage of staff PPE, and extensive cleaning of exam room while observing appropriate contact time as indicated for disinfecting solutions.

## 2019-11-10 NOTE — Assessment & Plan Note (Signed)
Her biggest fear is that she may have cancer and may not be there for her child.  We discussed the fact that I really do not think that that is what is going on.  Her blood work was reassuring.  Her thyroid ultrasound was normal.  We can certainly refer to ENT for further evaluation and possible scope I think this would actually give her a lot of reassurance.  I also discussed with her that the majority of time globus sensation is related to mood and has to do with muscle tension and discomfort in the neck periods were also go to do a trial of as needed diazepam which I think it help with her anxiety as well as act as a muscle relaxer to see if this improves the sensation that she is experiencing especially when it is more intense did warn her to try to not use the medication daily if possible.

## 2019-11-10 NOTE — Assessment & Plan Note (Addendum)
Mild cervical adenopathy, improved since starting antibiotic.  Will have her complete this as well as course of steroid.  No nodules/goiter palpated but will obtain US of thyroid for reassurance.  Check thyroid function tests.  Check CBC with diff today, discussed WBC may be elevated by viral illness and/or steroid use.  We did discuss ENT referral if symptoms persist and labs/imaging are unremarkable.

## 2019-11-10 NOTE — Assessment & Plan Note (Addendum)
GAD7 scores fairly high, admits to significant anxiety worsened by recent globus sensation. Discussed that globus sensation may actually be worsened by her anxiety.  Will start lexapro, 5mg  initially then increase to 10mg  daily.  Discussed side effects and delayed effect taking a few weeks to notice improvement with this.

## 2019-11-10 NOTE — Progress Notes (Addendum)
Virtual Visit via Video Note  I connected with Kelli Gill on 11/10/19 at 10:10 AM EDT by a video enabled telemedicine application and verified that I am speaking with the correct person using two identifiers.   I discussed the limitations of evaluation and management by telemedicine and the availability of in person appointments. The patient expressed understanding and agreed to proceed.  Patient location:  home Provider location: in office  Subjective:    CC: Throat issues  HPI: Kelli Gill is a 24 year old female who actually did a virtual visit on September 8 for a globus sensation.  She initially presented with some new onset low back pain and neck pain as well as diarrhea that had lasted for couple days she also had a sensation of feeling like there was a lump in her throat making it feel like it was difficult to swallow.  Her husband had actually recently tested positive for Covid.  He was actually feeling a little better by the time that she had the video visit.  She was then seen in person on September 13.  She was already on prednisone, keflex,  and azithromycin at that time and felt like she was still having a globus sensation.  Labs were ordered.  CBC was just mildly elevated at 13 typically consistent with some type of viral illness.  TSH level was normal.  He actually just had ultrasound of her thyroid done yesterday and it looked normal.  Her symptoms are still coming and going but she is concerned because now she is getting a numbness sensation at the base of the jaw and right underneath her jaw.  She says sometimes the discomfort will move towards her ear and even down into that trapezius muscle.  Dr. Ashley Royalty who saw her also felt like there was some postpartum depression going on as well and started her on Lexapro.  She said she took it for 1 day and felt like she was very sedated and did not even feel like a part of her family for the day she did start with a half of a tab.  She is  open to taking something but would like to try different medication if possible.   Past medical history, Surgical history, Family history not pertinant except as noted below, Social history, Allergies, and medications have been entered into the medical record, reviewed, and corrections made.   Review of Systems: No fevers, chills, night sweats, weight loss, chest pain, or shortness of breath.   Objective:    General: Speaking clearly in complete sentences without any shortness of breath.  Alert and oriented x3.  Normal judgment. No apparent acute distress.    Impression and Recommendations:    Globus sensation Her biggest fear is that she may have cancer and may not be there for her child.  We discussed the fact that I really do not think that that is what is going on.  Her blood work was reassuring.  Her thyroid ultrasound was normal.  We can certainly refer to ENT for further evaluation and possible scope I think this would actually give her a lot of reassurance.  I also discussed with her that the majority of time globus sensation is related to mood and has to do with muscle tension and discomfort in the neck periods were also go to do a trial of as needed diazepam which I think it help with her anxiety as well as act as a muscle relaxer to see if this improves the sensation  that she is experiencing especially when it is more intense did warn her to try to not use the medication daily if possible.  Anxiety and depression Reports years of increased anxiety and excess worry but it has definitely heightened since having had children.  We discussed referral for therapy/counseling actually think this could be really helpful for her.  Encouraged her to think about it and let me know.  We also discussed some techniques to help reduce the anxiety when she is in the moment and feeling stressed and anxious.  Okay to finish her prednisone and azithromycin I think she just has a couple of days left.   Discontinue Lexapro and switch to sertraline we will start with a really low dose and again as needed use of diazepam but did warn about not using it frequently.      Time spent in encounter 32 minutes  I discussed the assessment and treatment plan with the patient. The patient was provided an opportunity to ask questions and all were answered. The patient agreed with the plan and demonstrated an understanding of the instructions.   The patient was advised to call back or seek an in-person evaluation if the symptoms worsen or if the condition fails to improve as anticipated.   Nani Gasser, MD

## 2019-11-10 NOTE — Assessment & Plan Note (Signed)
Reports years of increased anxiety and excess worry but it has definitely heightened since having had children.  We discussed referral for therapy/counseling actually think this could be really helpful for her.  Encouraged her to think about it and let me know.  We also discussed some techniques to help reduce the anxiety when she is in the moment and feeling stressed and anxious.  Okay to finish her prednisone and azithromycin I think she just has a couple of days left.  Discontinue Lexapro and switch to sertraline we will start with a really low dose and again as needed use of diazepam but did warn about not using it frequently.

## 2019-11-11 ENCOUNTER — Telehealth: Payer: Self-pay

## 2019-11-11 NOTE — Telephone Encounter (Signed)
Pt called requesting if provider can give her an antibiotic for globus pharyngeus/sensation. Per pt, she does have an appointment with ENT in a couple of weeks. However, she is in constant discomfort around her neck area. Pt does not want to wait until she see ENT specialists for treatment options. She states that this is affecting her quality of life. Pls advise, thanks.

## 2019-11-12 MED ORDER — AZITHROMYCIN 250 MG PO TABS: ORAL_TABLET | ORAL | 0 refills | Status: AC

## 2019-11-12 NOTE — Telephone Encounter (Signed)
Task completed. Pt has been updated of provider's note/recommendations. Pt is aware to keep her upcoming appointment with ENT. She has been informed to contact the clinic if her symptoms should worsen or change. No other inquiries during the call.

## 2019-11-12 NOTE — Telephone Encounter (Signed)
Antibiotics sent to pharmacy.  Please let her know though that I really do not think this will likely help she is already been on 1 round of antibiotics and it did not resolve her symptoms.  But I will go ahead and call in a Z-Pak but again I think this has to do more with muscle tension in the neck causing this.

## 2019-11-18 DIAGNOSIS — R0989 Other specified symptoms and signs involving the circulatory and respiratory systems: Secondary | ICD-10-CM | POA: Diagnosis not present

## 2019-11-18 DIAGNOSIS — M542 Cervicalgia: Secondary | ICD-10-CM | POA: Diagnosis not present

## 2019-12-04 ENCOUNTER — Other Ambulatory Visit: Payer: Self-pay | Admitting: Family Medicine

## 2019-12-21 ENCOUNTER — Ambulatory Visit: Payer: Medicaid Other | Admitting: Family Medicine

## 2019-12-21 NOTE — Progress Notes (Deleted)
Established Patient Office Visit  Subjective:  Patient ID: Kelli Gill, female    DOB: May 20, 1995  Age: 24 y.o. MRN: [id number]  CC: No chief complaint on file.   HPI Kelli Gill presents for 6-week follow-up for anxiety and globus sensation.  She was initially started on Lexapro for postpartum depression but had significant sedation and felt disconnected.  We decided to try her on sertraline instead.  She has consulted with ENT over at [facility] for the globus sensation.  She had already tried and failed antibiotics and prednisone and had normal lab work-up.  Exam was essentially normal but they did recommend a trial of reflux medication.  Past Medical History:  Diagnosis Date  . Depression   . GERD (gastroesophageal reflux disease)     Past Surgical History:  Procedure Laterality Date  . WISDOM TOOTH EXTRACTION      Family History  Problem Relation Age of Onset  . Diabetes Mother   . Hypertension Father   . Hyperlipidemia Father   . Diabetes Maternal Grandfather     Social History   Socioeconomic History  . Marital status: Single    Spouse name: Not on file  . Number of children: Not on file  . Years of education: Not on file  . Highest education level: Not on file  Occupational History  . Not on file  Tobacco Use  . Smoking status: Never Smoker  . Smokeless tobacco: Never Used  Vaping Use  . Vaping Use: Never used  Substance and Sexual Activity  . Alcohol use: No  . Drug use: No  . Sexual activity: Yes    Birth control/protection: Pill  Other Topics Concern  . Not on file  Social History Narrative  . Not on file   Social Determinants of Health   Financial Resource Strain:   . Difficulty of Paying Living Expenses: Not on file  Food Insecurity:   . Worried About Programme researcher, broadcasting/film/video in the Last Year: Not on file  . Ran Out of Food in the Last Year: Not on file  Transportation Needs:   . Lack of Transportation (Medical): Not on file  . Lack  of Transportation (Non-Medical): Not on file  Physical Activity:   . Days of Exercise per Week: Not on file  . Minutes of Exercise per Session: Not on file  Stress:   . Feeling of Stress : Not on file  Social Connections:   . Frequency of Communication with Friends and Family: Not on file  . Frequency of Social Gatherings with Friends and Family: Not on file  . Attends Religious Services: Not on file  . Active Member of Clubs or Organizations: Not on file  . Attends Banker Meetings: Not on file  . Marital Status: Not on file  Intimate Partner Violence:   . Fear of Current or Ex-Partner: Not on file  . Emotionally Abused: Not on file  . Physically Abused: Not on file  . Sexually Abused: Not on file    Outpatient Medications Prior to Visit  Medication Sig Dispense Refill  . diazepam (VALIUM) 2 MG tablet Take 1 tablet (2 mg total) by mouth every 6 (six) hours as needed for anxiety or muscle spasms. 10 tablet 0  . KAITLIB FE 0.8-25 MG-MCG tablet Chew 0.8-25 mcg by mouth daily.    . sertraline (ZOLOFT) 25 MG tablet Take 1 tablet (25 mg total) by mouth daily. 90 tablet 1  . cephALEXin (KEFLEX) 500 MG capsule  Take 500 mg by mouth 3 (three) times daily.    . methylPREDNISolone (MEDROL DOSEPAK) 4 MG TBPK tablet Take by mouth.     No facility-administered medications prior to visit.    No Known Allergies  ROS Review of Systems    Objective:    Physical Exam  There were no vitals taken for this visit. Wt Readings from Last 3 Encounters:  11/08/19 193 lb 12.8 oz (87.9 kg)  11/03/19 185 lb (83.9 kg)  03/19/18 182 lb (82.6 kg)     Health Maintenance Due  Topic Date Due  . Hepatitis C Screening  Never done  . COVID-19 Vaccine (1) Never done  . INFLUENZA VACCINE  09/26/2019    There are no preventive care reminders to display for this patient.  Lab Results  Component Value Date   TSH 1.00 05/14/2016   Lab Results  Component Value Date   WBC 13.1 (H)  11/08/2019   HGB 13.5 11/08/2019   HCT 41.0 11/08/2019   MCV 87.6 11/08/2019   PLT 361 11/08/2019   Lab Results  Component Value Date   NA 140 01/07/2018   K 4.2 01/07/2018   CO2 27 01/07/2018   GLUCOSE 84 01/07/2018   BUN 8 01/07/2018   CREATININE 0.75 01/07/2018   BILITOT 0.3 01/07/2018   ALKPHOS 65 05/14/2016   AST 11 01/07/2018   ALT 9 01/07/2018   PROT 6.6 01/07/2018   ALBUMIN 4.1 05/14/2016   CALCIUM 9.2 01/07/2018   Lab Results  Component Value Date   CHOL 116 05/14/2016   Lab Results  Component Value Date   HDL 37 (L) 05/14/2016   No results found for: [facility] Lab Results  Component Value Date   TRIG 84 05/14/2016   Lab Results  Component Value Date   CHOLHDL 3.1 05/14/2016   Lab Results  Component Value Date   HGBA1C 4.9 05/14/2016      Assessment & Plan:   Problem List Items Addressed This Visit      Other   Globus sensation   Anxiety and depression - Primary      No orders of the defined types were placed in this encounter.   Follow-up: No follow-ups on file.    Nani Gasser, MD

## 2019-12-22 ENCOUNTER — Ambulatory Visit (INDEPENDENT_AMBULATORY_CARE_PROVIDER_SITE_OTHER): Payer: Medicaid Other | Admitting: Sports Medicine

## 2019-12-22 ENCOUNTER — Ambulatory Visit (INDEPENDENT_AMBULATORY_CARE_PROVIDER_SITE_OTHER): Payer: Medicaid Other

## 2019-12-22 ENCOUNTER — Other Ambulatory Visit: Payer: Self-pay

## 2019-12-22 DIAGNOSIS — M5412 Radiculopathy, cervical region: Secondary | ICD-10-CM

## 2019-12-22 DIAGNOSIS — M545 Low back pain, unspecified: Secondary | ICD-10-CM | POA: Diagnosis not present

## 2019-12-22 IMAGING — DX DG CERVICAL SPINE COMPLETE 4+V
6 series · 6 of 6 positions shown · non-contrast
Comparison: None.

CLINICAL DATA: Pain

EXAM:
CERVICAL SPINE - COMPLETE 4+ VIEW

[c-spine lat]
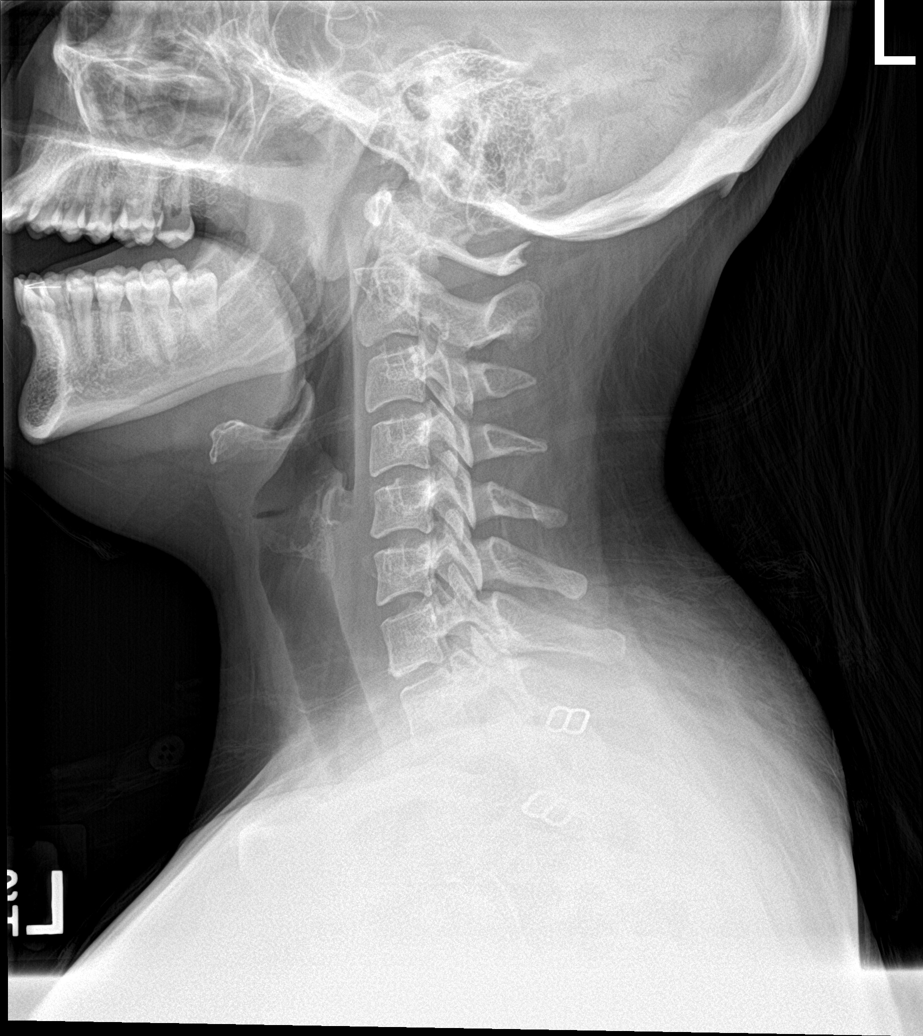

[c-spine obl (1 of 2)]
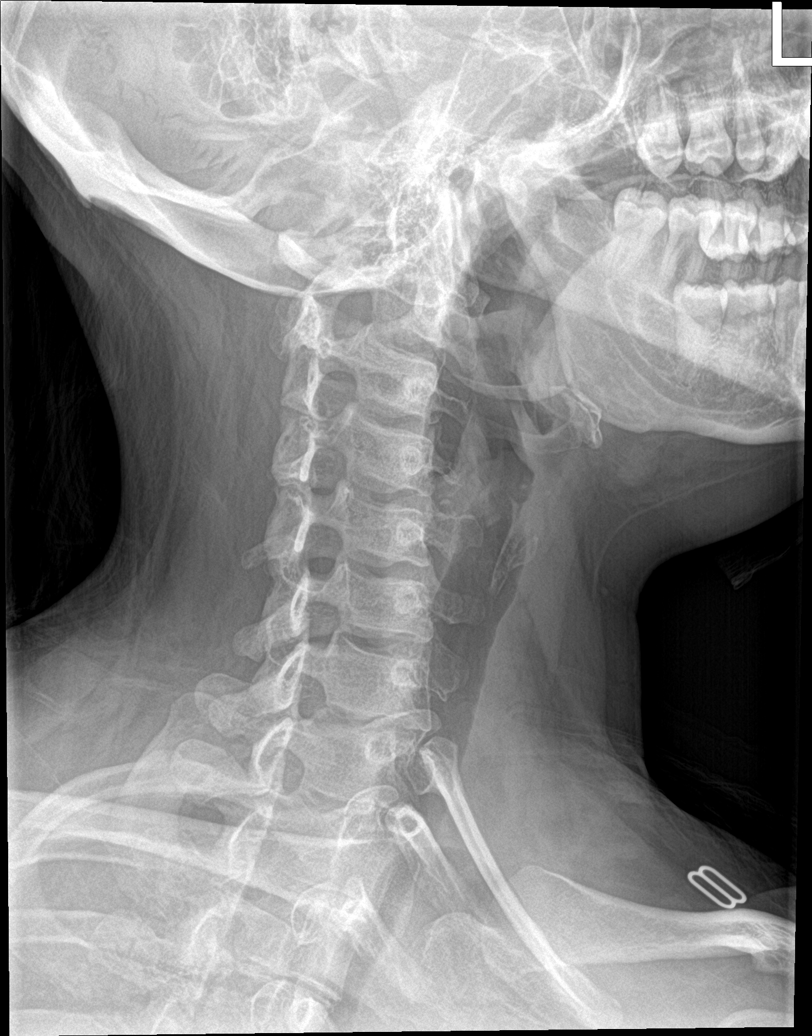

[c-spine obl (2 of 2)]
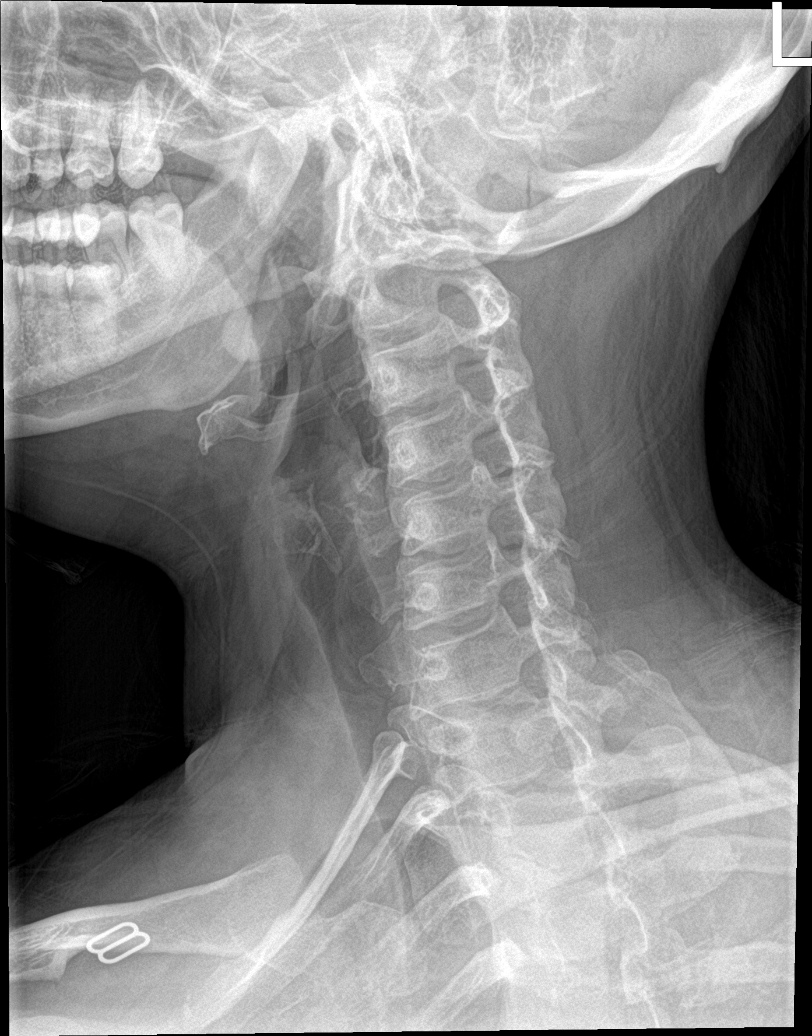

[c-spine ap]
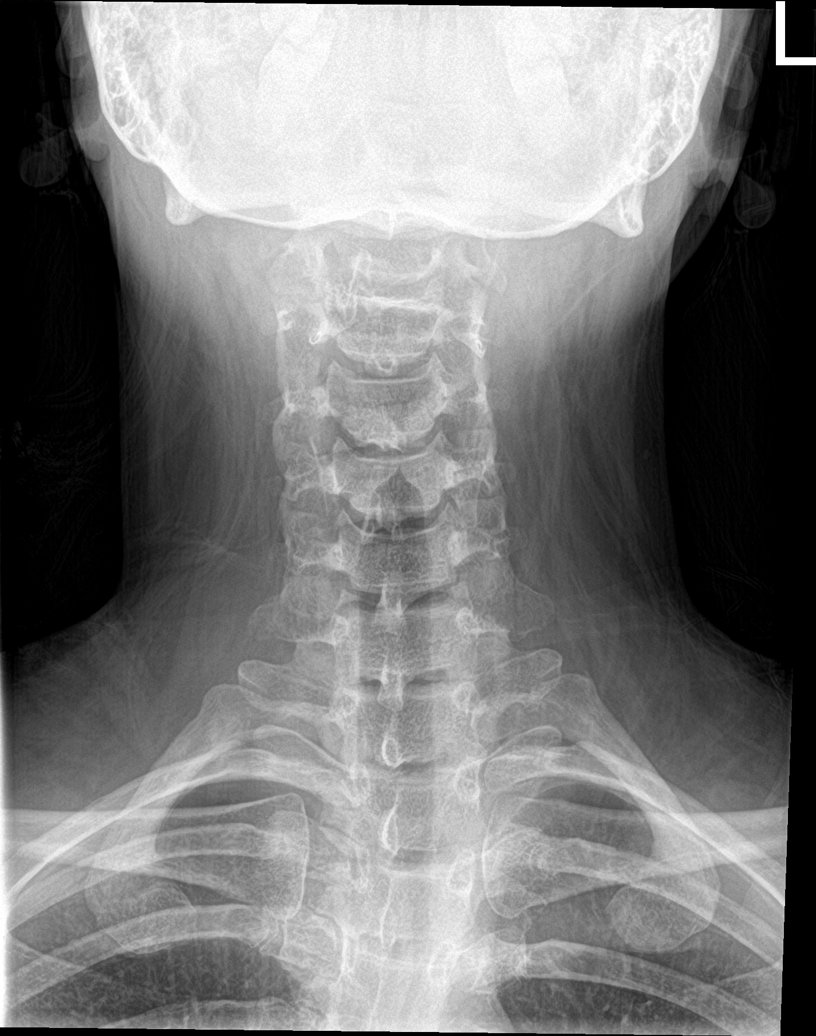

[c-spine open mouth]
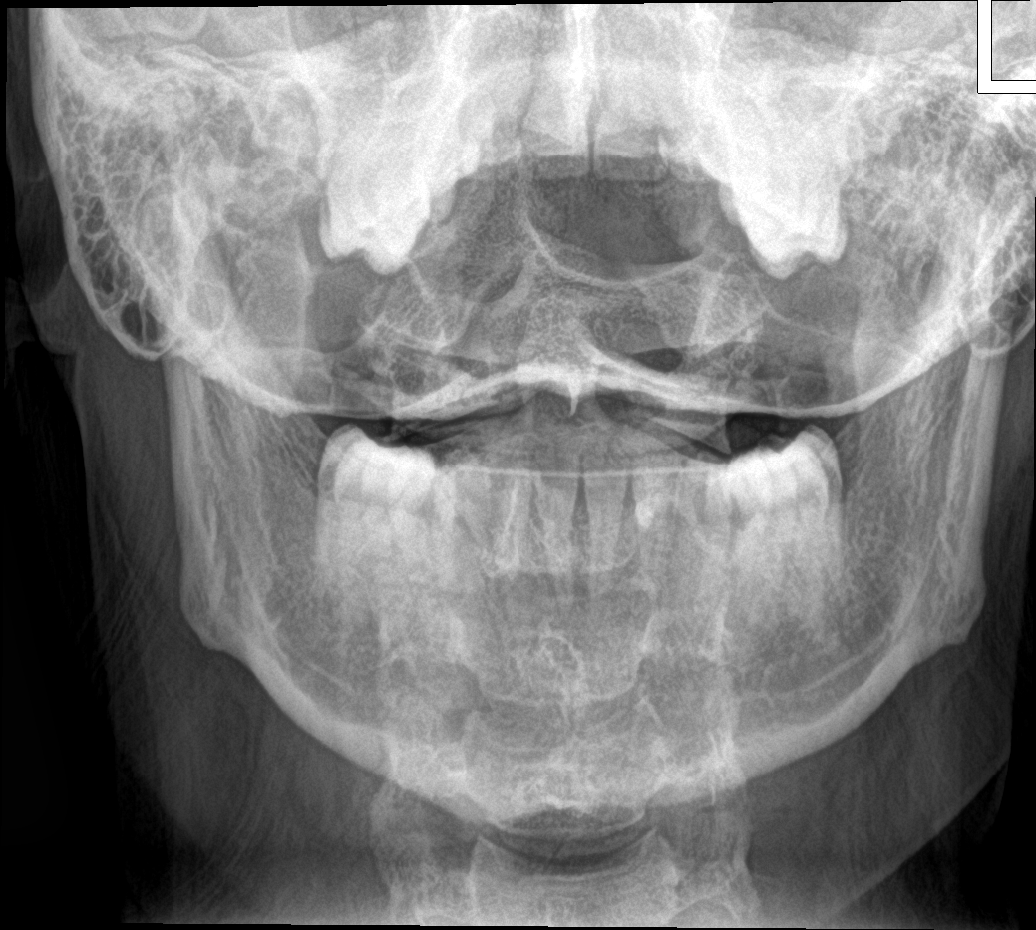

[[person_name]]
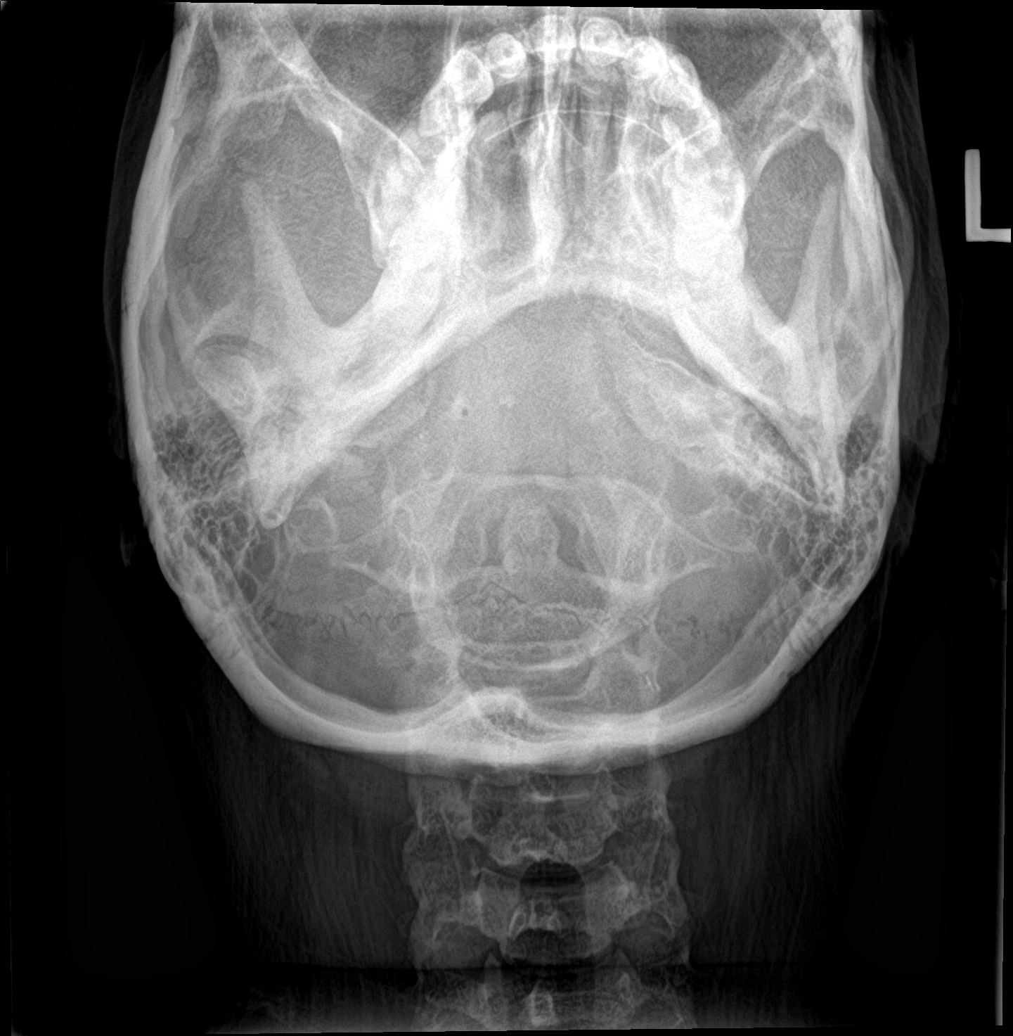

[6 of 6 positions shown; findings below may reference images not displayed]

FINDINGS: There is no evidence of cervical spine fracture or prevertebral soft
tissue swelling. Alignment is normal. No other significant bone
abnormalities are identified.
IMPRESSION: Negative cervical spine radiographs.

## 2019-12-22 MED ORDER — MELOXICAM 15 MG PO TABS: ORAL_TABLET | ORAL | 3 refills | Status: DC

## 2019-12-22 MED ORDER — PREDNISONE 50 MG PO TABS: ORAL_TABLET | ORAL | 0 refills | Status: DC

## 2019-12-22 NOTE — Progress Notes (Signed)
    Procedures performed today:    None.  Independent interpretation of notes and tests performed by another provider:   None.  Brief History, Exam, Impression, and Recommendations:    Radiculitis of right cervical region This is a pleasant 24 year old female, she has had several doctor visits to evaluate her right-sided neck pain, she notes pain radiating from the right side of her neck around her periscapular region and over her right upper arm. This occurred after spending several nights sleeping on a couch with multiple pillows while her husband had COVID-19. Symptoms are persistent, she has seen her PCP, ENT, no relief obtained. I do think her symptoms are likely radicular, adding 5 days of prednisone, meloxicam afterwards (formula feeding so this will be safe), x-rays, formal physical therapy. Return to see me in 4 to 6 weeks, MRI if no better.    ___________________________________________ Ihor Austin. Benjamin Stain, M.D., ABFM., CAQSM. Primary Care and Sports Medicine [facility]  Adjunct Instructor of Family Medicine  [employer]

## 2019-12-22 NOTE — Assessment & Plan Note (Addendum)
This is a pleasant 24 year old female, she has had several doctor visits to evaluate her right-sided neck pain, she notes pain radiating from the right side of her neck around her periscapular region and over her right upper arm. This occurred after spending several nights sleeping on a couch with multiple pillows while her husband had COVID-19. Symptoms are persistent, she has seen her PCP, ENT, no relief obtained. I do think her symptoms are likely radicular, adding 5 days of prednisone, meloxicam afterwards (formula feeding so this will be safe), x-rays, formal physical therapy. Return to see me in 4 to 6 weeks, MRI if no better.

## 2019-12-23 ENCOUNTER — Telehealth: Payer: Self-pay | Admitting: Family Medicine

## 2019-12-23 DIAGNOSIS — M5412 Radiculopathy, cervical region: Secondary | ICD-10-CM

## 2019-12-23 MED ORDER — MELOXICAM 15 MG PO TABS: ORAL_TABLET | ORAL | 3 refills | Status: DC

## 2019-12-23 MED ORDER — PREDNISONE 50 MG PO TABS: ORAL_TABLET | ORAL | 0 refills | Status: DC

## 2019-12-23 NOTE — Telephone Encounter (Signed)
PT. advised

## 2019-12-23 NOTE — Telephone Encounter (Signed)
Pt called. Her scripts have not yet been switched over to [facility] in [place], [address]  Thank you

## 2019-12-23 NOTE — Telephone Encounter (Signed)
Done.  Please let patient know

## 2019-12-24 ENCOUNTER — Telehealth: Payer: Self-pay

## 2019-12-24 NOTE — Telephone Encounter (Signed)
Thank you :)

## 2019-12-24 NOTE — Telephone Encounter (Signed)
Patient called to get xray results.  Checked with radiology, films have not been read yet.  Patient aware.

## 2019-12-25 DIAGNOSIS — R0989 Other specified symptoms and signs involving the circulatory and respiratory systems: Secondary | ICD-10-CM | POA: Diagnosis not present

## 2019-12-25 DIAGNOSIS — R221 Localized swelling, mass and lump, neck: Secondary | ICD-10-CM | POA: Diagnosis not present

## 2019-12-25 DIAGNOSIS — M542 Cervicalgia: Secondary | ICD-10-CM | POA: Diagnosis not present

## 2019-12-27 ENCOUNTER — Telehealth (INDEPENDENT_AMBULATORY_CARE_PROVIDER_SITE_OTHER): Payer: Medicaid Other | Admitting: Family Medicine

## 2019-12-27 ENCOUNTER — Encounter: Payer: Self-pay | Admitting: Family Medicine

## 2019-12-27 DIAGNOSIS — R002 Palpitations: Secondary | ICD-10-CM

## 2019-12-27 DIAGNOSIS — M542 Cervicalgia: Secondary | ICD-10-CM

## 2019-12-27 MED ORDER — GABAPENTIN 100 MG PO CAPS: ORAL_CAPSULE | ORAL | 1 refills | Status: AC

## 2019-12-27 NOTE — Progress Notes (Signed)
Virtual Visit via Video Note  I connected with Kelli Gill on 12/27/19 at 10:10 AM EDT by a video enabled telemedicine application and verified that I am speaking with the correct person using two identifiers.   I discussed the limitations of evaluation and management by telemedicine and the availability of in person appointments. The patient expressed understanding and agreed to proceed.  Patient location: at home.   Provider location: in office  Subjective:    CC: throat pain  HPI: Patient complains of anterior throat pain.  She was dealing with this previously and we had actually referred her to ENT after fairly normal ultrasound.  She had direct laryngoscopy evaluation.  They recommended a trial of omeprazole and says the lump sensation in her throat did improve after taking the omeprazole.  In fact it had completely resolved until more recently when she got an cold.  She says that the discomfort in the anterior neck became so uncomfortable that she actually went to the emergency department.  He feels like the pain is mostly over the anterior neck but almost behind the tongue.  She says it is more uncomfortable if she turns her neck or completely flexes.  Pt stated that she is having a sensation of a lump around her vocal cord area that feels like tightness and pressure and turning her head L/R is painful. It travels from her jawline down under her tongue.  She has been taking prednisone as well as Tylenol and that helps some. She has no problems swallowing. She c/o heart racing. Her last TSH w/reflex to free T4 was done 1 month ago 2.03.  She was seen at the ED [facility]) over the weekend and wasn't tested for this.   She reports that her father recently had some heart issues quivering heart. Was told that she will need to get checked.   She was seen by Dr. Karie Schwalbe on 10/27    Past medical history, Surgical history, Family history not pertinant except as noted below, Social  history, Allergies, and medications have been entered into the medical record, reviewed, and corrections made.   Review of Systems: No fevers, chills, night sweats, weight loss, chest pain, or shortness of breath.   Objective:    General: Speaking clearly in complete sentences without any shortness of breath.  Alert and oriented x3.  Normal judgment. No apparent acute distress.    Impression and Recommendations:    Palpitations We discussed finding out exactly what diagnosis her father has and when she comes in for her follow-up in a couple weeks to go ahead and do an EKG at that point in time.  Neck pain Globus sensation has actually improved after taking omeprazole but this is a little bit different.  We discussed a trial of gabapentin since the steroid and Tylenol are only helping minimally and the pain is keeping her up at night.  Also consider this could be radicular pain from her cervical spine as she has seen sports medicine for this and I do think it could be related especially since it is worse with rotation of the neck and position change.  In addition we will consider MRI for further work-up.  I think we need to address her anxiety separately.  She did discontinue her SSRI because she felt like it was not helping.  We will try gabapentin for pain relief.  New rx sent to pharmacy.  MRI ordered will initial PA  Time spent in encounter 30 minutes  I discussed the assessment and treatment plan with the patient. The patient was provided an opportunity to ask questions and all were answered. The patient agreed with the plan and demonstrated an understanding of the instructions.   The patient was advised to call back or seek an in-person evaluation if the symptoms worsen or if the condition fails to improve as anticipated.   Kelli Lecher, MD

## 2019-12-27 NOTE — Progress Notes (Signed)
Pt reports that she just got over a cold and doesn't believe that she has mono or strep. Pt stated that she is having a sensation of a lump around her vocal cord area that feels like tightness and pressure and turning her head L/R is painful. It travels from her jawline down under her tongue. She has no problems swallowing. She c/o heart racing. Her last TSH w/reflex to free T4 was done 1 month ago 2.03.  She was seen at the [facility]) over the weekend and wasn't tested for this.   She reports that her father recently had some heart issues quivering heart. Was told that she will need to get checked.   She was seen by Dr. Karie Schwalbe on 10/27

## 2019-12-27 NOTE — Assessment & Plan Note (Signed)
We discussed finding out exactly what diagnosis her father has and when she comes in for her follow-up in a couple weeks to go ahead and do an EKG at that point in time.

## 2019-12-27 NOTE — Assessment & Plan Note (Signed)
Globus sensation has actually improved after taking omeprazole but this is a little bit different.  We discussed a trial of gabapentin since the steroid and Tylenol are only helping minimally and the pain is keeping her up at night.  Also consider this could be radicular pain from her cervical spine as she has seen sports medicine for this and I do think it could be related especially since it is worse with rotation of the neck and position change.  In addition we will consider MRI for further work-up.  I think we need to address her anxiety separately.  She did discontinue her SSRI because she felt like it was not helping.

## 2019-12-28 ENCOUNTER — Ambulatory Visit: Payer: Medicaid Other | Admitting: Physical Therapy

## 2019-12-29 ENCOUNTER — Encounter: Payer: Self-pay | Admitting: Family Medicine

## 2019-12-29 DIAGNOSIS — M5412 Radiculopathy, cervical region: Secondary | ICD-10-CM

## 2019-12-29 DIAGNOSIS — M542 Cervicalgia: Secondary | ICD-10-CM

## 2020-01-10 ENCOUNTER — Other Ambulatory Visit: Payer: Self-pay

## 2020-01-10 ENCOUNTER — Ambulatory Visit (INDEPENDENT_AMBULATORY_CARE_PROVIDER_SITE_OTHER): Payer: Medicaid Other | Admitting: Family Medicine

## 2020-01-10 ENCOUNTER — Encounter: Payer: Self-pay | Admitting: Family Medicine

## 2020-01-10 VITALS — BP 118/58 | HR 90 | Ht 65.0 in | Wt 201.0 lb

## 2020-01-10 DIAGNOSIS — R002 Palpitations: Secondary | ICD-10-CM | POA: Diagnosis not present

## 2020-01-10 DIAGNOSIS — M542 Cervicalgia: Secondary | ICD-10-CM

## 2020-01-10 NOTE — Telephone Encounter (Signed)
Patient did not know that her message came threw MyChart no need to call back

## 2020-01-10 NOTE — Assessment & Plan Note (Signed)
Normal exam today she has MRI scheduled for Monday so hopefully that will be revealing.  We discussed that so far things have been normal and reassuring and so really working on that mental piece of trying to be positive is possible versus worst-case scenario.

## 2020-01-10 NOTE — Assessment & Plan Note (Addendum)
Will initiate further work-up by evaluating for thyroid disorder, anemia, electrolyte imbalance etc.  EKG performed today.  EKG shows rate of 84 bpm, normal sinus rhythm with no acute ST-T wave changes.  We will also get her scheduled for Zio patch.  Suspect PVCs or PACs as probable underlying cause.  Avoid caffeine and decongestants.

## 2020-01-10 NOTE — Patient Instructions (Signed)
We will follow up once I get your heart monitor results back.  We will also let you know about your MRI results next week.

## 2020-01-10 NOTE — Progress Notes (Signed)
Established Patient Office Visit  Subjective:  Patient ID: Kelli Gill, female    DOB: 1995/03/23  Age: 24 y.o. MRN: [id number]  CC:  Chief Complaint  Patient presents with  . Palpitations    HPI [name] presents for palpitations.  Occurring mostly in the evenings when she lies down.  Almost every day they are occurring the last about 5 to 20 seconds at a time it seems to be triggered more when she lays on her right side sometimes they can happen with her sitting up but usually it is towards the end of the day.  She did find out that her dad was recently diagnosed with atrial flutter  She does say the meloxicam has been helpful with treating her discomfort.  She denies any significant posterior neck pain.  Past Medical History:  Diagnosis Date  . Depression   . GERD (gastroesophageal reflux disease)     Past Surgical History:  Procedure Laterality Date  . WISDOM TOOTH EXTRACTION      Family History  Problem Relation Age of Onset  . Diabetes Mother   . Hypertension Father   . Hyperlipidemia Father   . Diabetes Maternal Grandfather     Social History   Socioeconomic History  . Marital status: Single    Spouse name: Not on file  . Number of children: Not on file  . Years of education: Not on file  . Highest education level: Not on file  Occupational History  . Not on file  Tobacco Use  . Smoking status: Never Smoker  . Smokeless tobacco: Never Used  Vaping Use  . Vaping Use: Never used  Substance and Sexual Activity  . Alcohol use: No  . Drug use: No  . Sexual activity: Yes    Birth control/protection: Pill  Other Topics Concern  . Not on file  Social History Narrative  . Not on file   Social Determinants of Health   Financial Resource Strain:   . Difficulty of Paying Living Expenses: Not on file  Food Insecurity:   . Worried About Programme researcher, broadcasting/film/video in the Last Year: Not on file  . Ran Out of Food in the Last Year: Not on file   Transportation Needs:   . Lack of Transportation (Medical): Not on file  . Lack of Transportation (Non-Medical): Not on file  Physical Activity:   . Days of Exercise per Week: Not on file  . Minutes of Exercise per Session: Not on file  Stress:   . Feeling of Stress : Not on file  Social Connections:   . Frequency of Communication with Friends and Family: Not on file  . Frequency of Social Gatherings with Friends and Family: Not on file  . Attends Religious Services: Not on file  . Active Member of Clubs or Organizations: Not on file  . Attends Banker Meetings: Not on file  . Marital Status: Not on file  Intimate Partner Violence:   . Fear of Current or Ex-Partner: Not on file  . Emotionally Abused: Not on file  . Physically Abused: Not on file  . Sexually Abused: Not on file    Outpatient Medications Prior to Visit  Medication Sig Dispense Refill  . gabapentin (NEURONTIN) 100 MG capsule 1 tab po QHS x 2 nights and then increase to 1 tab PO BID. After one week can increase to 1 in AM and 2 QHS 90 capsule 1  . KAITLIB FE 0.8-25 MG-MCG tablet Chew 0.8-25  mcg by mouth daily.    . meloxicam (MOBIC) 15 MG tablet One tab PO qAM with a meal for 2 weeks, then daily prn pain. 30 tablet 3  . omeprazole (PRILOSEC) 40 MG capsule Take 40 mg by mouth daily.     No facility-administered medications prior to visit.    No Known Allergies  ROS Review of Systems    Objective:    Physical Exam Constitutional:      Appearance: She is well-developed.  HENT:     Head: Normocephalic and atraumatic.  Cardiovascular:     Rate and Rhythm: Normal rate and regular rhythm.     Heart sounds: Normal heart sounds.  Pulmonary:     Effort: Pulmonary effort is normal.     Breath sounds: Normal breath sounds.  Musculoskeletal:     Cervical back: Neck supple. No rigidity or tenderness.  Lymphadenopathy:     Cervical: No cervical adenopathy.  Skin:    General: Skin is warm and dry.   Neurological:     Mental Status: She is alert and oriented to person, place, and time.  Psychiatric:        Behavior: Behavior normal.     BP (!) 118/58   Pulse 90   Ht 5\' 5"  (1.651 m)   Wt 201 lb (91.2 kg)   LMP 12/17/2019   SpO2 99%   BMI 33.45 kg/m  Wt Readings from Last 3 Encounters:  01/10/20 201 lb (91.2 kg)  11/08/19 193 lb 12.8 oz (87.9 kg)  11/03/19 185 lb (83.9 kg)     Health Maintenance Due  Topic Date Due  . Hepatitis C Screening  Never done    There are no preventive care reminders to display for this patient.  Lab Results  Component Value Date   TSH 1.00 05/14/2016   Lab Results  Component Value Date   WBC 13.1 (H) 11/08/2019   HGB 13.5 11/08/2019   HCT 41.0 11/08/2019   MCV 87.6 11/08/2019   PLT 361 11/08/2019   Lab Results  Component Value Date   NA 140 01/07/2018   K 4.2 01/07/2018   CO2 27 01/07/2018   GLUCOSE 84 01/07/2018   BUN 8 01/07/2018   CREATININE 0.75 01/07/2018   BILITOT 0.3 01/07/2018   ALKPHOS 65 05/14/2016   AST 11 01/07/2018   ALT 9 01/07/2018   PROT 6.6 01/07/2018   ALBUMIN 4.1 05/14/2016   CALCIUM 9.2 01/07/2018   Lab Results  Component Value Date   CHOL 116 05/14/2016   Lab Results  Component Value Date   HDL 37 (L) 05/14/2016   No results found for: [facility] Lab Results  Component Value Date   TRIG 84 05/14/2016   Lab Results  Component Value Date   CHOLHDL 3.1 05/14/2016   Lab Results  Component Value Date   HGBA1C 4.9 05/14/2016      Assessment & Plan:   Problem List Items Addressed This Visit      Other   Palpitations - Primary    Will initiate further work-up by evaluating for thyroid disorder, anemia, electrolyte imbalance etc.  EKG performed today.  EKG shows rate of 84 bpm, normal sinus rhythm with no acute ST-T wave changes.  We will also get her scheduled for Zio patch.  Suspect PVCs or PACs as probable underlying cause.  Avoid caffeine and decongestants.      Relevant Orders   EKG  12-Lead   COMPLETE METABOLIC PANEL WITH GFR   Lipid panel  TSH   CBC with Differential/Platelet   LONG TERM MONITOR-LIVE TELEMETRY (3-14 DAYS)   Neck pain    Normal exam today she has MRI scheduled for Monday so hopefully that will be revealing.  We discussed that so far things have been normal and reassuring and so really working on that mental piece of trying to be positive is possible versus worst-case scenario.      Relevant Orders   COMPLETE METABOLIC PANEL WITH GFR   Lipid panel   TSH   CBC with Differential/Platelet      No orders of the defined types were placed in this encounter.   Follow-up: Return if symptoms worsen or fail to improve.    Nani Gasser, MD

## 2020-01-11 LAB — CBC WITH DIFFERENTIAL/PLATELET
Absolute Monocytes: 670 cells/uL (ref 200–950)
Basophils Absolute: 29 cells/uL (ref 0–200)
Basophils Relative: 0.4 %
Eosinophils Absolute: 281 cells/uL (ref 15–500)
Eosinophils Relative: 3.9 %
HCT: 36.7 % (ref 35.0–45.0)
Hemoglobin: 12.2 g/dL (ref 11.7–15.5)
Lymphs Abs: 1238 cells/uL (ref 850–3900)
MCH: 29.5 pg (ref 27.0–33.0)
MCHC: 33.2 g/dL (ref 32.0–36.0)
MCV: 88.6 fL (ref 80.0–100.0)
MPV: 10 fL (ref 7.5–12.5)
Monocytes Relative: 9.3 %
Neutro Abs: 4982 cells/uL (ref 1500–7800)
Neutrophils Relative %: 69.2 %
Platelets: 296 10*3/uL (ref 140–400)
RBC: 4.14 10*6/uL (ref 3.80–5.10)
RDW: 11.7 % (ref 11.0–15.0)
Total Lymphocyte: 17.2 %
WBC: 7.2 10*3/uL (ref 3.8–10.8)

## 2020-01-11 LAB — COMPLETE METABOLIC PANEL WITH GFR
AG Ratio: 1.5 (calc) (ref 1.0–2.5)
ALT: 19 U/L (ref 6–29)
AST: 18 U/L (ref 10–30)
Albumin: 4.1 g/dL (ref 3.6–5.1)
Alkaline phosphatase (APISO): 97 U/L (ref 31–125)
BUN: 8 mg/dL (ref 7–25)
CO2: 26 mmol/L (ref 20–32)
Calcium: 9 mg/dL (ref 8.6–10.2)
Chloride: 106 mmol/L (ref 98–110)
Creat: 0.54 mg/dL (ref 0.50–1.10)
GFR, Est African American: 151 mL/min/{1.73_m2} (ref >=60)
GFR, Est Non African American: 130 mL/min/{1.73_m2} (ref >=60)
Globulin: 2.7 g/dL (calc) (ref 1.9–3.7)
Glucose, Bld: 90 mg/dL (ref 65–99)
Potassium: 4.3 mmol/L (ref 3.5–5.3)
Sodium: 141 mmol/L (ref 135–146)
Total Bilirubin: 0.3 mg/dL (ref 0.2–1.2)
Total Protein: 6.8 g/dL (ref 6.1–8.1)

## 2020-01-11 LAB — LIPID PANEL
Cholesterol: 167 mg/dL (ref <200)
HDL: 69 mg/dL (ref >=50)
LDL Cholesterol (Calc): 77 mg/dL (calc)
Non-HDL Cholesterol (Calc): 98 mg/dL (calc) (ref <130)
Total CHOL/HDL Ratio: 2.4 (calc) (ref <5.0)
Triglycerides: 120 mg/dL (ref <150)

## 2020-01-11 LAB — TSH: TSH: 2.2 mIU/L

## 2020-01-17 ENCOUNTER — Other Ambulatory Visit: Payer: Self-pay

## 2020-01-17 ENCOUNTER — Ambulatory Visit (INDEPENDENT_AMBULATORY_CARE_PROVIDER_SITE_OTHER): Payer: Medicaid Other

## 2020-01-17 DIAGNOSIS — M542 Cervicalgia: Secondary | ICD-10-CM | POA: Diagnosis not present

## 2020-01-17 IMAGING — MR MR NECK SOFT TISSUE ONLY WO/W CM
8 of 12 series · 32 of 48 positions shown · IV contrast (gadavist)
Comparison: Thyroid ultrasound [DATE]. Cervical spine
radiographs [DATE].

CLINICAL DATA: 24-year-old female with neck pain and tightness
radiating from the jaw to the sublingual space, throat and neck
following upper respiratory infection. ENT diagnosed with silent
reflux. But symptoms not improved on proton pump inhibitor. Query
lymphadenopathy.

EXAM:
MRI OF THE NECK WITH CONTRAST
TECHNIQUE: Multiplanar, multisequence MR imaging was performed following the
administration of intravenous contrast.
CONTRAST:  10mL GADAVIST GADOBUTROL 1 MMOL/ML IV SOLN

[Series 5: T1 · axial · 4.0mm · 0.86mm/px · z∈[-113,+51]mm · 4 of 34 slices shown (1 of 3)]
[im 1/34]
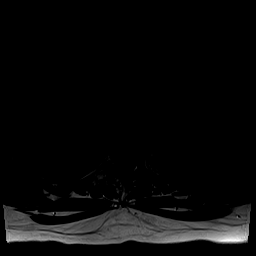
[im 12/34]
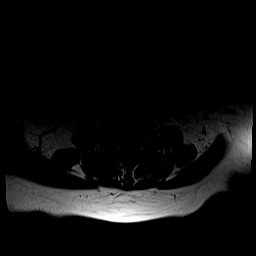
[im 23/34]
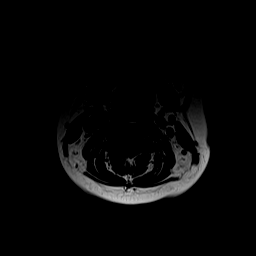
[im 34/34]
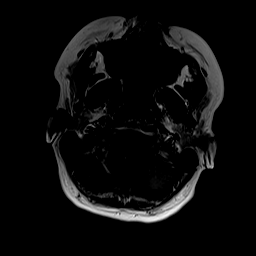

[Series 6: T2 fat-sat · axial · 4.0mm · 0.69mm/px · z∈[-113,+51]mm · 4 of 34 slices shown (1 of 2)]
[im 1/34]
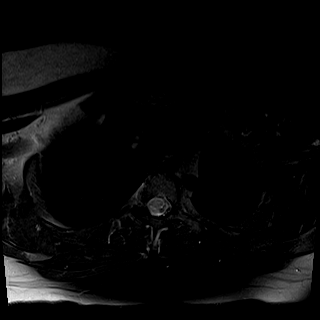
[im 12/34]
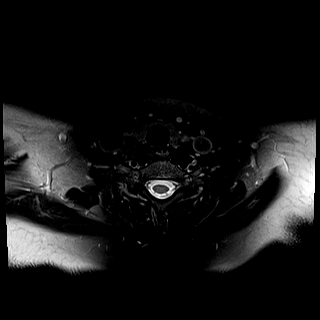
[im 23/34]
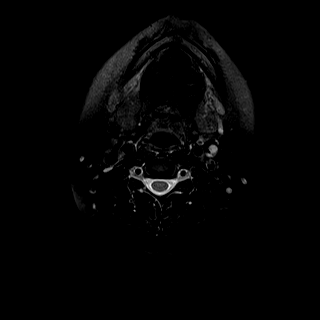
[im 34/34]
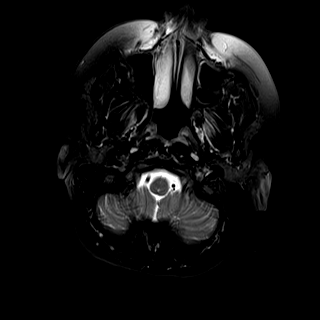

[Series 7: T1 fat-sat · axial · 4.0mm · 0.86mm/px · z∈[-113,+51]mm · 4 of 34 slices shown]
[im 1/34]
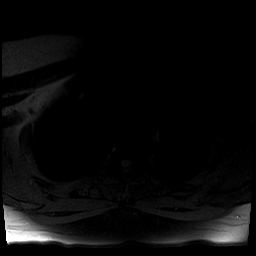
[im 12/34]
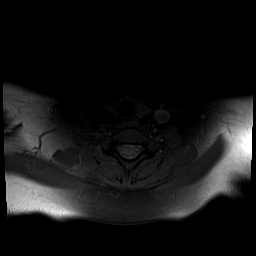
[im 23/34]
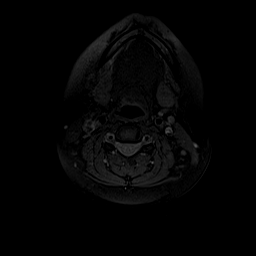
[im 34/34]
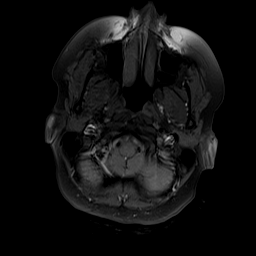

[Series 9: T1 · coronal · 4.0mm · 0.47mm/px · 4 of 29 slices shown (2 of 3)]
[im 1/29]
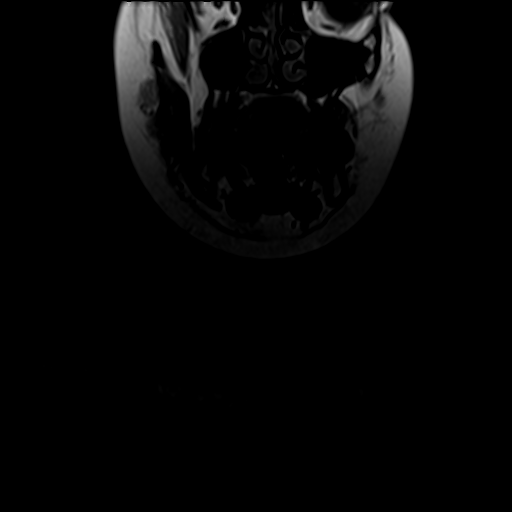
[im 10/29]
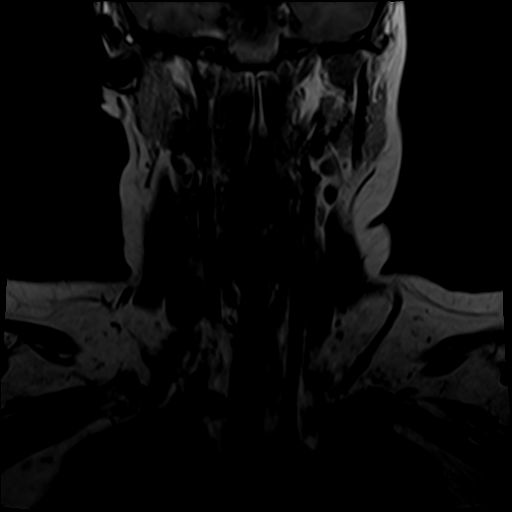
[im 19/29]
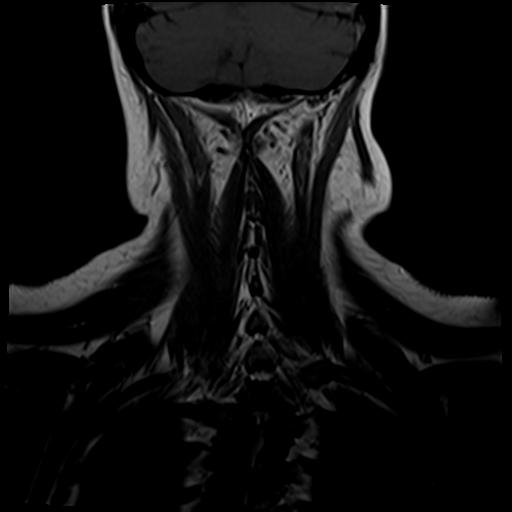
[im 29/29]
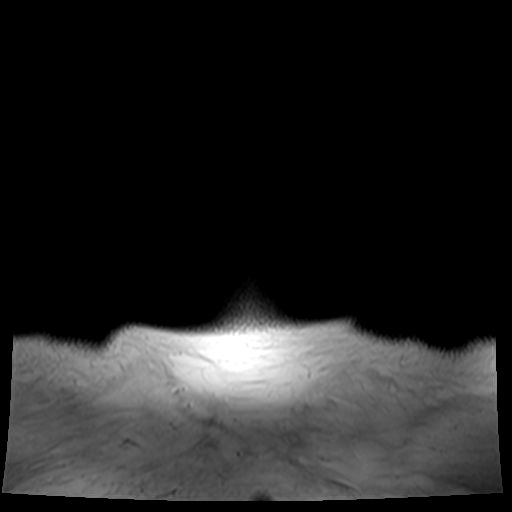

[Series 10: T2 fat-sat · coronal · 4.0mm · 0.86mm/px · 4 of 29 slices shown (2 of 2)]
[im 1/29]
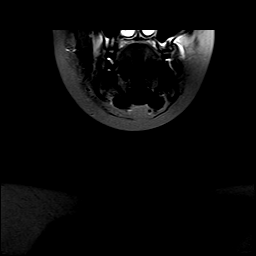
[im 10/29]
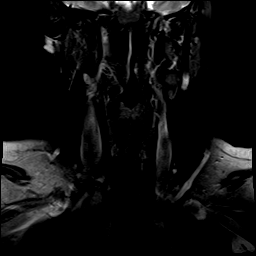
[im 19/29]
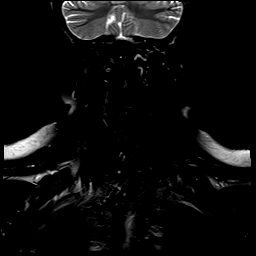
[im 29/29]
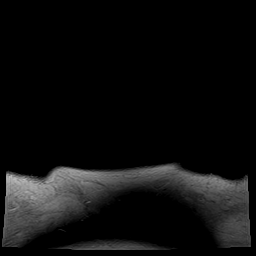

[Series 13: T1 · sagittal · 4.0mm · 0.45mm/px · 4 of 30 slices shown (3 of 3)]
[im 1/30]
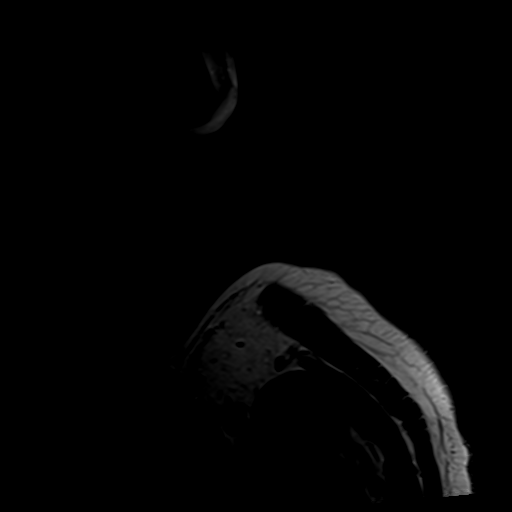
[im 10/30]
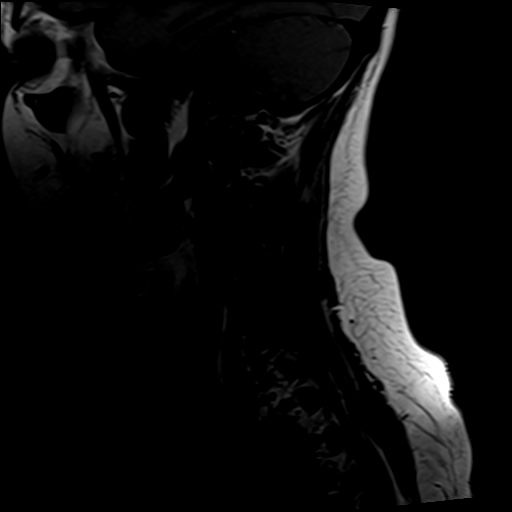
[im 20/30]
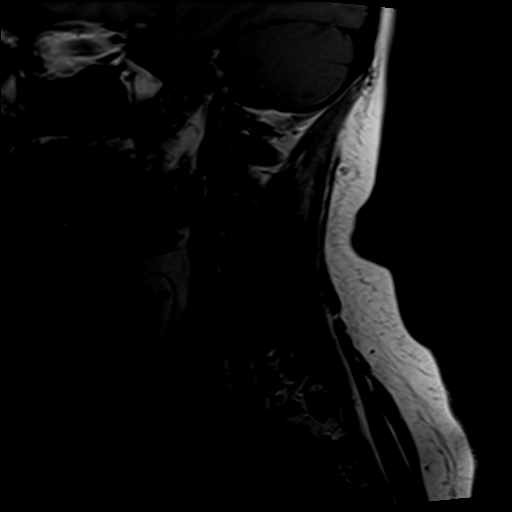
[im 30/30]
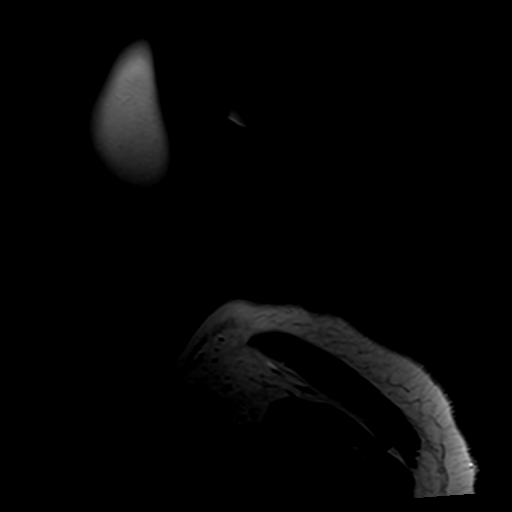

[Series 14: T1 fat-sat post-contrast · axial · 4.0mm · 0.86mm/px · z∈[-113,+51]mm · 4 of 34 slices shown (1 of 2)]
[im 1/34]
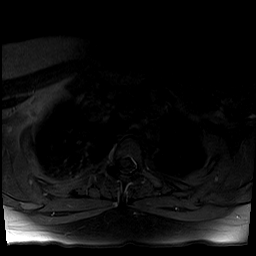
[im 12/34]
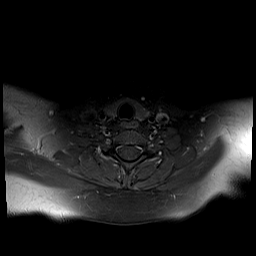
[im 23/34]
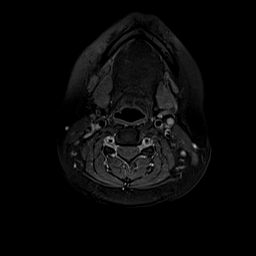
[im 34/34]
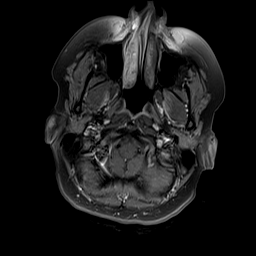

[Series 15: T1 fat-sat post-contrast · coronal · 4.0mm · 0.49mm/px · 4 of 29 slices shown (2 of 2)]
[im 1/29]
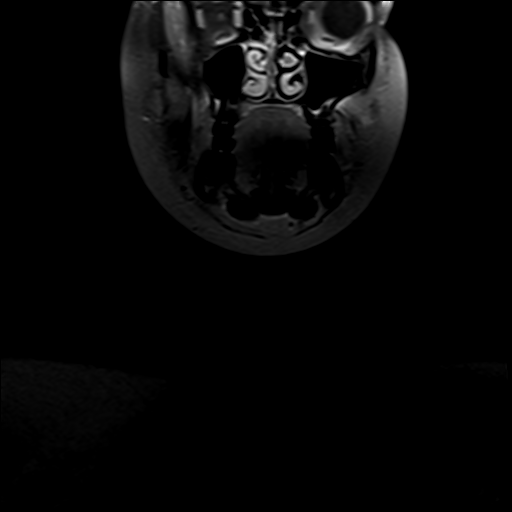
[im 10/29]
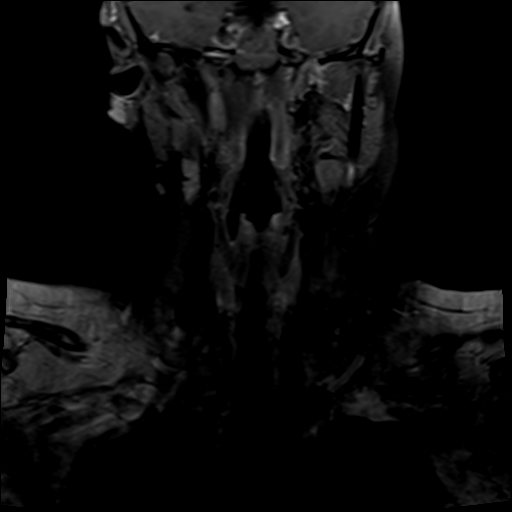
[im 19/29]
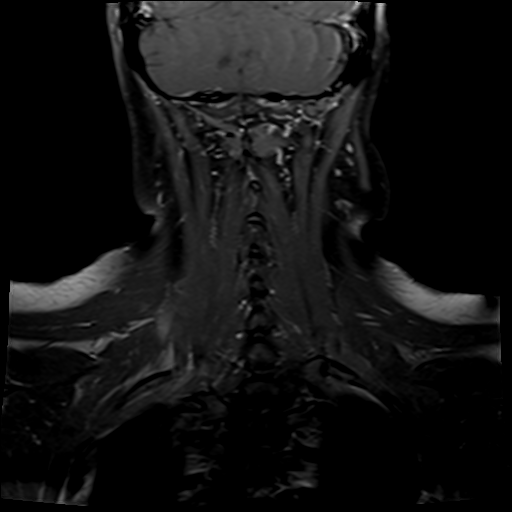
[im 29/29]
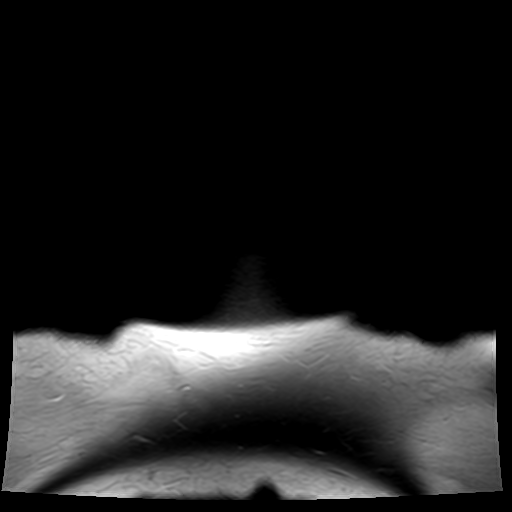

[32 of 48 positions shown; findings below may reference images not displayed]

FINDINGS: Pharynx and larynx: Normal larynx and pharynx soft tissue contours
and enhancement. Normal parapharyngeal and retropharyngeal spaces.

Salivary glands: Negative sublingual space. Sublingual glands appear
normal. Submandibular spaces and submandibular glands appear
symmetric and normal. Likewise, visible parotid glands are symmetric
and within normal limits. Unremarkable masticator spaces.

Thyroid: Negative.

Lymph nodes: No cervical lymphadenopathy.

Bilateral cervical lymph node stations appear within normal limits.
The largest nodes are at the bilateral level 2A nodal stations
measuring 6-7 mm short axis. Small symmetric bilateral level 1 lymph
nodes measure 5-6 mm short axis. No abnormally enlarged or
heterogeneous nodes.

STIR imaging in all three planes reveals no evidence of soft tissue
inflammation in the face or neck.

Vascular: Major vascular flow voids in the neck are preserved.

Limited intracranial: Negative.

Visualized orbits: Negative.

Mastoids and visualized paranasal sinuses: Clear.

Skeleton: No marrow edema or evidence of acute osseous abnormality.
Visualized bone marrow signal is within normal limits. Mandible and
bilateral TMJ appear unremarkable. Normal for age visible cervical
spine. Suggestion of some dextroconvex upper thoracic scoliosis
(series 12, image 15).

Upper chest: Negative. Lung apices and superior mediastinum appear
unremarkable.
IMPRESSION: Normal MRI appearance of the Neck.

No lymphadenopathy or inflammation. No explanation for pain or
symptoms.

## 2020-01-17 MED ORDER — GADOBUTROL 1 MMOL/ML IV SOLN
9.0000 mL | Freq: Once | INTRAVENOUS | Status: AC | PRN
  Administered 2020-01-17: 10 mL via INTRAVENOUS

## 2020-01-18 ENCOUNTER — Telehealth: Payer: Self-pay | Admitting: Family Medicine

## 2020-01-18 ENCOUNTER — Other Ambulatory Visit (INDEPENDENT_AMBULATORY_CARE_PROVIDER_SITE_OTHER): Payer: Medicaid Other

## 2020-01-18 DIAGNOSIS — R002 Palpitations: Secondary | ICD-10-CM | POA: Diagnosis not present

## 2020-01-18 NOTE — Telephone Encounter (Signed)
Please call patient to see what her concerns are.

## 2020-01-18 NOTE — Telephone Encounter (Signed)
Pt called. She has a question regarding her MRI results and wants a call back.  Thank you.

## 2020-01-19 ENCOUNTER — Ambulatory Visit: Payer: Medicaid Other | Admitting: Sports Medicine

## 2020-01-24 NOTE — Telephone Encounter (Signed)
Patient has since reviewed these results on MyChart

## 2020-01-28 DIAGNOSIS — N3001 Acute cystitis with hematuria: Secondary | ICD-10-CM | POA: Diagnosis not present

## 2020-02-03 DIAGNOSIS — N898 Other specified noninflammatory disorders of vagina: Secondary | ICD-10-CM | POA: Diagnosis not present

## 2020-02-03 DIAGNOSIS — R350 Frequency of micturition: Secondary | ICD-10-CM | POA: Diagnosis not present

## 2020-02-03 DIAGNOSIS — Z3202 Encounter for pregnancy test, result negative: Secondary | ICD-10-CM | POA: Diagnosis not present

## 2020-02-09 DIAGNOSIS — Z3202 Encounter for pregnancy test, result negative: Secondary | ICD-10-CM | POA: Diagnosis not present

## 2020-02-09 DIAGNOSIS — Z118 Encounter for screening for other infectious and parasitic diseases: Secondary | ICD-10-CM | POA: Diagnosis not present

## 2020-02-09 DIAGNOSIS — R102 Pelvic and perineal pain: Secondary | ICD-10-CM | POA: Diagnosis not present

## 2020-02-09 DIAGNOSIS — N3010 Interstitial cystitis (chronic) without hematuria: Secondary | ICD-10-CM | POA: Diagnosis not present

## 2020-02-09 DIAGNOSIS — Z113 Encounter for screening for infections with a predominantly sexual mode of transmission: Secondary | ICD-10-CM | POA: Diagnosis not present

## 2020-02-09 DIAGNOSIS — B373 Candidiasis of vulva and vagina: Secondary | ICD-10-CM | POA: Diagnosis not present

## 2020-02-15 ENCOUNTER — Other Ambulatory Visit: Payer: Self-pay

## 2020-02-15 ENCOUNTER — Ambulatory Visit: Payer: Medicaid Other | Attending: Family Medicine

## 2020-02-15 DIAGNOSIS — R293 Abnormal posture: Secondary | ICD-10-CM | POA: Insufficient documentation

## 2020-02-15 DIAGNOSIS — M542 Cervicalgia: Secondary | ICD-10-CM | POA: Insufficient documentation

## 2020-02-15 DIAGNOSIS — M6281 Muscle weakness (generalized): Secondary | ICD-10-CM | POA: Insufficient documentation

## 2020-02-15 DIAGNOSIS — M5412 Radiculopathy, cervical region: Secondary | ICD-10-CM | POA: Diagnosis not present

## 2020-02-15 NOTE — Patient Instructions (Signed)
Access Code: [email or web address] URL: [email or web address] Date: 02/15/2020 Prepared by: Gardiner Rhyme  Exercises Sidelying Thoracic Rotation - 1-2 x daily - 7 x weekly - 1-2 sets - 15 reps Cat-Camel - 1-2 x daily - 7 x weekly - 3 sets - 10 reps Seated Thoracic Lumbar Extension with Pectoralis Stretch - 1 x daily - 7 x weekly - 1-2 sets - 10 reps Seated Scapular Retraction - 3-5 x daily - 7 x weekly - 3 sets - 10 reps Seated Cervical Retraction - 3-5 x daily - 7 x weekly - 1-2 sets - 10 reps - 5 seconds hold Shoulder External Rotation and Scapular Retraction with Resistance - 1-2 x daily - 7 x weekly - 1 sets - 10 reps - 5 seconds hold

## 2020-02-15 NOTE — Therapy (Addendum)
[facility] [address]  [place], [place], [address] Phone: [phone]   Fax:  [phone]  Physical Therapy Evaluation / Discharge Summary  Patient Details  Name: Kelli Gill MRN: [id number] Date of Birth: December 28, 1995 Referring Provider (PT): Inge Rise, MD   Encounter Date: 02/15/2020   PT End of Session - 02/15/20 1710    Visit Number 1    Number of Visits 8    Date for PT Re-Evaluation 04/25/20    Authorization Type [place] Medicaid [employer]    PT Start Time 1615    PT Stop Time 1700    PT Time Calculation (min) 45 min    Activity Tolerance Patient tolerated treatment well    Behavior During Therapy [facility] for tasks assessed/performed           Past Medical History:  Diagnosis Date  . Depression   . GERD (gastroesophageal reflux disease)     Past Surgical History:  Procedure Laterality Date  . WISDOM TOOTH EXTRACTION      There were no vitals filed for this visit.    Subjective Assessment - 02/15/20 1619    Subjective Back in September, her husband had covid and had to be in their room for 14 days, so she had to be on thecouch with throw pillows. She had pain in her neck moving to throat and had different scans, but no answers. She felt pressure and tightness, like she was being choked. The ENT say it was silent reflux and had a huge knot on the side of her right neck in SCM, that moves down shoulder blade on R. That then went away, but her kids got really sick and it came back. She has then been put on some medications, but doesn't want to have to take them. her PCP thinks it's stress. She does hands clasped behind back, forward flexion, levator and upper trap stretches. Currently, she does not have the pain, but she is terrified it will come back again and when it does, she cannot function properly.    Limitations House hold activities    Diagnostic tests XR, MRI: (-)    Patient Stated Goals  Prevent return of pain, tools to help get rid of pain if it returns    Currently in Pain? No/denies    Pain Score 0-No pain   has been a 10/10 at worst   Pain Location Neck    Pain Orientation Right    Pain Descriptors / Indicators Aching    Pain Type Chronic pain    Pain Radiating Towards sternum, front of neck    Pain Onset More than a month ago    Pain Frequency Intermittent    Aggravating Factors  stress              [facility] PT Assessment - 02/15/20 0001      Assessment   Medical Diagnosis Neck Pain, Radiculitis of right cervical region    Referring Provider (PT) Inge Rise, MD    Onset Date/Surgical Date 11/16/19    Hand Dominance Right    Next MD Visit after PT    Prior Therapy none      Precautions   Precautions None      Restrictions   Weight Bearing Restrictions No      Balance Screen   Has the patient fallen in the past 6 months No    Has the patient had a decrease in activity level because  of a fear of falling?  No    Is the patient reluctant to leave their home because of a fear of falling?  No      Prior Function   Level of Independence Independent    Vocation Part time employment    Vocation Requirements sitting    Leisure spend quality time with husband and to exercise      Posture/Postural Control   Posture Comments mild rounded shoulders and forward head      ROM / Strength   AROM / PROM / Strength AROM;Strength      AROM   AROM Assessment Site Cervical    Cervical Flexion 55    Cervical Extension 50    Cervical - Right Side Bend 30    Cervical - Left Side Bend 30    Cervical - Right Rotation 66    Cervical - Left Rotation 62      Strength   Overall Strength Comments 4+/5 B 4-/5 B ER    Strength Assessment Site Shoulder      Palpation   Palpation comment myofascial restriction to R UT, LS, SCM                      Objective measurements completed on examination: See above findings.               PT  Education - 02/15/20 1707    Education Details Diagnosis, prognosis, posture, HEP, POC    Person(s) Educated Patient    Methods Explanation;Demonstration;Tactile cues;Verbal cues;Handout    Comprehension Verbalized understanding;Returned demonstration;Verbal cues required;Tactile cues required            PT Short Term Goals - 02/15/20 1733      PT SHORT TERM GOAL #1   Title Pt will be independent and compliant with initial HEP.    Time 2    Period Weeks    Status New    Target Date 02/29/20      PT SHORT TERM GOAL #2   Title Pt will increase L cervical rotation to at least 66 for symmetry with R rotation.    Baseline 62    Time 3    Period Weeks    Status New    Target Date 03/07/20      PT SHORT TERM GOAL #3   Title Pt will demonstrate upright posture 50% of the time, demonstrating improvement in resting muscle length.    Time 3    Period Weeks    Status New    Target Date 03/07/20             PT Long Term Goals - 02/15/20 1735      PT LONG TERM GOAL #1   Title Pt will be independent with long term HEP for maintenance and continued progression.    Time 8    Period Weeks    Status New    Target Date 04/11/20      PT LONG TERM GOAL #2   Title Pt will demonstrate DNF hold in supine for at least 30" demonstrating muscle endurance appropriate for age.    Time 8    Period Weeks    Status New    Target Date 04/11/20      PT LONG TERM GOAL #3   Title Pt will report no recurrence of 10/10 pain with pain <4/10 at worst for improved ability to participate in leisure activities and care for children.  Time 8    Period Weeks    Status New    Target Date 04/11/20      PT LONG TERM GOAL #4   Title Pt will increase B shoulder ER to 4+/5 for improved ability to maintain neutral shoulder positioning.    Time 8    Period Weeks    Status New    Target Date 04/11/20                  Plan - 02/15/20 1713    Clinical Impression Statement Pt is a 24 yo  female with onset of neck pain 3 months ago of insidious onset, but pt think it's from stress. XR and MRI ruled out any space occupying lesions or bony abnormalities. Pain is likely MSK in nature. Pt has neck ROM and shoulder strength WFL demonstrating mild weakness in B shoulder ER. Pt is TTP to R UT, LS and SCM with significant dissipation of tension with correction of posture into full upright position. Pt demonstrates impairments in posture with forward rounding of shoulders and mild forward head, mild shoulder weakness, DNF and periscapular weakness. She was educated on impairments, posture, diagnosis, prognosis, HEP, and POC verbalizing understanding and consent to tx. She will benefit from skilled PT 1x/week for 6-8 weeks to address impairments.    Personal Factors and Comorbidities Past/Current Experience;Profession;Comorbidity 1    Comorbidities Depression    Examination-Activity Limitations Sit;Lift;Carry    Examination-Participation Restrictions Occupation;Driving    Stability/Clinical Decision Making Stable/Uncomplicated    Clinical Decision Making Low    Rehab Potential Good    PT Frequency 1x / week    PT Duration 8 weeks    PT Treatment/Interventions ADLs/Self Care Home Management;Iontophoresis 87m/ml Dexamethasone;Moist Heat;Cryotherapy;Taping;Spinal Manipulations;Joint Manipulations;Manual techniques;Dry needling;Passive range of motion;Patient/family education;Neuromuscular re-education;Therapeutic exercise;Therapeutic activities    PT Next Visit Plan Assess response HEP and update PRN, manual therapy to address soft tissue restrictions, postural alignment and stabilization, test DNF endurance    PT Home Exercise Plan [email or web address]    Consulted and Agree with Plan of Care Patient           Patient will benefit from skilled therapeutic intervention in order to improve the following deficits and impairments:  Increased fascial restricitons,Postural dysfunction,Decreased  strength,Improper body mechanics,Impaired perceived functional ability,Pain  Visit Diagnosis: Neck pain - Plan: PT plan of care cert/re-cert  Radiculitis of right cervical region - Plan: PT plan of care cert/re-cert  Abnormal posture - Plan: PT plan of care cert/re-cert  Muscle weakness (generalized) - Plan: PT plan of care cert/re-cert     Problem List Patient Active Problem List   Diagnosis Date Noted  . Neck pain 12/27/2019  . Radiculitis of right cervical region 12/22/2019  . Globus sensation 11/03/2019  . Anxiety and depression 11/29/2016  . Palpitations 05/15/2016  . Obesity 01/15/2016  . Left foot pain 12/18/2015  . Eczematous dermatitis 09/07/2012  . Plantar fasciitis 08/11/2012    KIzell [place] PT, DPT 02/15/2020, 5:39 PM  [facility] [place] [address]Phone: [phone]  Fax:  [phone] Name: Kelli TischerMRN: [id number] of Birth: 711-04-1995  PHYSICAL THERAPY DISCHARGE SUMMARY  Visits from Start of Care: 1  Current functional level related to goals / functional outcomes:   Refer to above clinical impression for status as of last visit on 02/15/2020. Patient did not return to PT due to issues with insurance, therefore  will proceed with discharge from PT for this episode.   Remaining deficits:   As above.    Education / Equipment:   Initial HEP  Plan: Patient agrees to discharge.  Patient goals were not met. Patient is being discharged due to the patient's request.  ?????     Percival Spanish, PT, MPT 04/21/20, 9:18 AM  [facility] [address]  [place], [place], [address] Phone: [phone]   Fax:  [phone]

## 2020-03-02 ENCOUNTER — Ambulatory Visit: Payer: Medicaid Other | Admitting: Physical Therapy

## 2020-03-09 ENCOUNTER — Ambulatory Visit: Payer: Medicaid Other | Admitting: Physical Therapy

## 2020-03-16 ENCOUNTER — Ambulatory Visit: Payer: Medicaid Other

## 2020-03-30 ENCOUNTER — Ambulatory Visit: Payer: Medicaid Other | Admitting: Physical Therapy

## 2020-04-06 ENCOUNTER — Encounter: Payer: Medicaid Other | Admitting: Physical Therapy

## 2020-04-13 ENCOUNTER — Encounter: Payer: Medicaid Other | Admitting: Physical Therapy

## 2020-04-19 ENCOUNTER — Other Ambulatory Visit: Payer: Self-pay

## 2020-04-19 ENCOUNTER — Ambulatory Visit: Payer: Medicaid Other | Admitting: Family Medicine

## 2020-04-19 ENCOUNTER — Encounter: Payer: Self-pay | Admitting: Family Medicine

## 2020-04-19 VITALS — BP 110/74 | HR 71 | Ht 65.0 in | Wt 198.0 lb

## 2020-04-19 DIAGNOSIS — M542 Cervicalgia: Secondary | ICD-10-CM | POA: Diagnosis not present

## 2020-04-19 NOTE — Progress Notes (Signed)
Established Patient Office Visit  Subjective:  Patient ID: Kelli Gill, female    DOB: [birth year]  Age: 25 y.o. MRN: [id number]  CC:  Chief Complaint  Patient presents with  . Neck Pain    HPI Kelli Gill presents for persistent right-sided neck pain.  She says that it just feels like a tight muscle she can actually feel the knots in it at times sometimes she can get her husband to really do a deep massage and she will get some temporary relief but then it tends to come back she does work on a Anim[employer].  She uses icy hot, [place] oil, heating pad and tries to do some range of motion stretches.  She says sometimes it is even hard to rotate her head to the right.  She did go to physical therapy once in December.  That particular day she was not having a lot of discomfort or pain.  She did not have the best experience there that day.  Past Medical History:  Diagnosis Date  . Depression   . GERD (gastroesophageal reflux disease)     Past Surgical History:  Procedure Laterality Date  . WISDOM TOOTH EXTRACTION      Family History  Problem Relation Age of Onset  . Diabetes Mother   . Hypertension Father   . Hyperlipidemia Father   . Diabetes Maternal Grandfather     Social History   Socioeconomic History  . Marital status: Single    Spouse name: Not on file  . Number of children: Not on file  . Years of education: Not on file  . Highest education level: Not on file  Occupational History  . Not on file  Tobacco Use  . Smoking status: Never Smoker  . Smokeless tobacco: Never Used  Vaping Use  . Vaping Use: Never used  Substance and Sexual Activity  . Alcohol use: No  . Drug use: No  . Sexual activity: Yes    Birth control/protection: Pill  Other Topics Concern  . Not on file  Social History Narrative  . Not on file   Social Determinants of Health   Financial Resource Strain: Not on file  Food Insecurity: Not on file  Transportation Needs: Not on file   Physical Activity: Not on file  Stress: Not on file  Social Connections: Not on file  Intimate Partner Violence: Not on file    Outpatient Medications Prior to Visit  Medication Sig Dispense Refill  . gabapentin (NEURONTIN) 100 MG capsule 1 tab po QHS x 2 nights and then increase to 1 tab PO BID. After one week can increase to 1 in AM and 2 QHS 90 capsule 1  . KAITLIB FE 0.8-25 MG-MCG tablet Chew 0.8-25 mcg by mouth daily.    . meloxicam (MOBIC) 15 MG tablet One tab PO qAM with a meal for 2 weeks, then daily prn pain. 30 tablet 3  . omeprazole (PRILOSEC) 40 MG capsule Take 40 mg by mouth daily.     No facility-administered medications prior to visit.    No Known Allergies  ROS Review of Systems    Objective:    Physical Exam Vitals reviewed.  Constitutional:      Appearance: She is well-developed and well-nourished.  HENT:     Head: Normocephalic and atraumatic.  Eyes:     Extraocular Movements: EOM normal.     Conjunctiva/sclera: Conjunctivae normal.  Cardiovascular:     Rate and Rhythm: Normal rate.  Pulmonary:  Effort: Pulmonary effort is normal.  Musculoskeletal:     Comments: Normal flexion, decreased rotation to the right. Tender palpable knots on the right Paraspinous muscle on the neck.    Skin:    General: Skin is dry.     Coloration: Skin is not pale.  Neurological:     Mental Status: She is alert and oriented to person, place, and time.  Psychiatric:        Mood and Affect: Mood and affect normal.        Behavior: Behavior normal.     BP 110/74   Pulse 71   Ht 5\' 5"  (1.651 m)   Wt 198 lb (89.8 kg)   SpO2 100%   BMI 32.95 kg/m  Wt Readings from Last 3 Encounters:  04/19/20 198 lb (89.8 kg)  01/10/20 201 lb (91.2 kg)  11/08/19 193 lb 12.8 oz (87.9 kg)     Health Maintenance Due  Topic Date Due  . Hepatitis C Screening  Never done    There are no preventive care reminders to display for this patient.  Lab Results  Component Value  Date   TSH 2.20 01/10/2020   Lab Results  Component Value Date   WBC 7.2 01/10/2020   HGB 12.2 01/10/2020   HCT 36.7 01/10/2020   MCV 88.6 01/10/2020   PLT 296 01/10/2020   Lab Results  Component Value Date   NA 141 01/10/2020   K 4.3 01/10/2020   CO2 26 01/10/2020   GLUCOSE 90 01/10/2020   BUN 8 01/10/2020   CREATININE 0.54 01/10/2020   BILITOT 0.3 01/10/2020   ALKPHOS 65 05/14/2016   AST 18 01/10/2020   ALT 19 01/10/2020   PROT 6.8 01/10/2020   ALBUMIN 4.1 05/14/2016   CALCIUM 9.0 01/10/2020   Lab Results  Component Value Date   CHOL 167 01/10/2020   Lab Results  Component Value Date   HDL 69 01/10/2020   Lab Results  Component Value Date   LDLCALC 77 01/10/2020   Lab Results  Component Value Date   TRIG 120 01/10/2020   Lab Results  Component Value Date   CHOLHDL 2.4 01/10/2020   Lab Results  Component Value Date   HGBA1C 4.9 05/14/2016      Assessment & Plan:   Problem List Items Addressed This Visit      Other   Neck pain - Primary   Relevant Orders   Ambulatory referral to Physical Therapy     Neck pain consistent with recurrent spasm in the muscle tissue.  I think she would really benefit from formal physical therapy where they can do multiple modalities including massage, ultrasound, maybe even adding in a TENS unit and possibly even dry needling which she would be very open to especially if it would provide her some relief.  That way they can also work on rehabbing the muscle tissue as well.  She has cut back on the Mobic and the gabapentin as well.  Only encouraged her to use if she feels like it is truly helpful if it is not the not really rather her back off of the medications.  No orders of the defined types were placed in this encounter.   Follow-up: Return if symptoms worsen or fail to improve.    11-09-1988, MD

## 2020-05-09 ENCOUNTER — Ambulatory Visit: Payer: Medicaid Other | Admitting: Physical Therapy

## 2020-06-05 ENCOUNTER — Other Ambulatory Visit: Payer: Self-pay

## 2020-06-05 ENCOUNTER — Ambulatory Visit (INDEPENDENT_AMBULATORY_CARE_PROVIDER_SITE_OTHER): Payer: Medicaid Other | Admitting: Family Medicine

## 2020-06-05 ENCOUNTER — Encounter: Payer: Self-pay | Admitting: Family Medicine

## 2020-06-05 VITALS — BP 113/79 | HR 77 | Wt 200.0 lb

## 2020-06-05 DIAGNOSIS — M5412 Radiculopathy, cervical region: Secondary | ICD-10-CM

## 2020-06-05 DIAGNOSIS — R519 Headache, unspecified: Secondary | ICD-10-CM | POA: Diagnosis not present

## 2020-06-05 DIAGNOSIS — M542 Cervicalgia: Secondary | ICD-10-CM | POA: Diagnosis not present

## 2020-06-05 MED ORDER — DIAZEPAM 10 MG PO TABS: 10.0000 mg | ORAL_TABLET | Freq: Once | ORAL | 0 refills | Status: AC

## 2020-06-05 NOTE — Assessment & Plan Note (Signed)
She continues to have radicular symptoms.  She has completed at least 6 weeks of physician recommended conservative treatment with PT and medication, without significant improvement.  I have order MRI for further evaluation.

## 2020-06-05 NOTE — Progress Notes (Signed)
Kelli Gill - 25 y.o. female MRN [id number]  Date of birth: [birth year]  Subjective Chief Complaint  Patient presents with  . Neck Pain    HPI Kelli Gill is 25 y.o. female here today for follow up of chronic neck pain.  She continues to have severe pain.  Pain is located throughout the entire neck with radiation into shoulders.  She also has some radiation into shoulder blades and arms at times.  She also has facial pain that radiates through her jaw an d cheek areas.  She is concerned about thyroid and lymph node enlargement.  She has had MRI of soft tissue of her neck previously without abnormality. She did try PT x2, most recently had dry needling without much improvement.  She also has seen a chiropractor which gives her relief temporarily.  She has not had much relief with meloxicam or gabapentin at current dose.   ROS:  A comprehensive ROS was completed and negative except as noted per HPI  No Known Allergies  Past Medical History:  Diagnosis Date  . Depression   . GERD (gastroesophageal reflux disease)     Past Surgical History:  Procedure Laterality Date  . WISDOM TOOTH EXTRACTION      Social History   Socioeconomic History  . Marital status: Single    Spouse name: Not on file  . Number of children: Not on file  . Years of education: Not on file  . Highest education level: Not on file  Occupational History  . Not on file  Tobacco Use  . Smoking status: Never Smoker  . Smokeless tobacco: Never Used  Vaping Use  . Vaping Use: Never used  Substance and Sexual Activity  . Alcohol use: No  . Drug use: No  . Sexual activity: Yes    Birth control/protection: Pill  Other Topics Concern  . Not on file  Social History Narrative  . Not on file   Social Determinants of Health   Financial Resource Strain: Not on file  Food Insecurity: Not on file  Transportation Needs: Not on file  Physical Activity: Not on file  Stress: Not on file  Social Connections: Not  on file    Family History  Problem Relation Age of Onset  . Diabetes Mother   . Hypertension Father   . Hyperlipidemia Father   . Diabetes Maternal Grandfather     Health Maintenance  Topic Date Due  . Hepatitis C Screening  Never done  . HPV VACCINES (1 - 2-dose series) Never done  . COVID-19 Vaccine (1) 01/25/2021 (Originally 09/07/1998)  . INFLUENZA VACCINE  09/25/2020  . PAP-Cervical Cytology Screening  10/28/2020  . PAP SMEAR-Modifier  10/28/2020  . TETANUS/TDAP  02/23/2025  . HIV Screening  Completed     ----------------------------------------------------------------------------------------------------------------------------------------------------------------------------------------------------------------- Physical Exam BP 113/79 (BP Location: Right Arm, Patient Position: Sitting, Cuff Size: Large)   Pulse 77   Wt 200 lb (90.7 kg)   SpO2 100%   BMI 33.28 kg/m   Physical Exam Constitutional:      Appearance: Normal appearance.  HENT:     Head: Normocephalic and atraumatic.     Right Ear: Tympanic membrane normal.     Left Ear: Tympanic membrane normal.     Mouth/Throat:     Mouth: Mucous membranes are moist.  Eyes:     General: No scleral icterus. Neck:     Comments: Tenderness along anterior neck muscles.  Upper trapezius is tight.   Cardiovascular:     Rate  and Rhythm: Normal rate and regular rhythm.  Pulmonary:     Effort: Pulmonary effort is normal.     Breath sounds: Normal breath sounds.  Musculoskeletal:     Cervical back: Neck supple.  Lymphadenopathy:     Cervical: No cervical adenopathy.  Neurological:     General: No focal deficit present.     Mental Status: She is alert.  Psychiatric:        Mood and Affect: Mood normal.        Behavior: Behavior normal.      ------------------------------------------------------------------------------------------------------------------------------------------------------------------------------------------------------------------- Assessment and Plan  Radiculitis of right cervical region She continues to have radicular symptoms.  She has completed at least 6 weeks of physician recommended conservative treatment with PT and medication, without significant improvement.  I have order MRI for further evaluation.    Neck pain I think part of her neck pain is radicular.  She is also having some facial and jaw pain.   ? Possibility of trigeminal neuralgia.  Will obtain MRI of her brain to evaluate for any possible nerve entrapment/compressive neuropathy causing her pain.  She may increase her gabapentin.     Meds ordered this encounter  Medications  . diazepam (VALIUM) 10 MG tablet    Sig: Take 1 tablet (10 mg total) by mouth once for 1 dose. Take 1 hour prior to MRI.    Dispense:  1 tablet    Refill:  0    No follow-ups on file.    This visit occurred during the SARS-CoV-2 public health emergency.  Safety protocols were in place, including screening questions prior to the visit, additional usage of staff PPE, and extensive cleaning of exam room while observing appropriate contact time as indicated for disinfecting solutions.

## 2020-06-05 NOTE — Assessment & Plan Note (Signed)
I think part of her neck pain is radicular.  She is also having some facial and jaw pain.   ? Possibility of trigeminal neuralgia.  Will obtain MRI of her brain to evaluate for any possible nerve entrapment/compressive neuropathy causing her pain.  She may increase her gabapentin.

## 2020-06-17 ENCOUNTER — Other Ambulatory Visit: Payer: Medicaid Other

## 2020-06-25 ENCOUNTER — Ambulatory Visit (INDEPENDENT_AMBULATORY_CARE_PROVIDER_SITE_OTHER): Payer: Medicaid Other

## 2020-06-25 ENCOUNTER — Other Ambulatory Visit: Payer: Self-pay

## 2020-06-25 DIAGNOSIS — M5412 Radiculopathy, cervical region: Secondary | ICD-10-CM

## 2020-06-25 DIAGNOSIS — R519 Headache, unspecified: Secondary | ICD-10-CM

## 2020-06-25 DIAGNOSIS — M542 Cervicalgia: Secondary | ICD-10-CM | POA: Diagnosis not present

## 2020-06-25 DIAGNOSIS — M50222 Other cervical disc displacement at C5-C6 level: Secondary | ICD-10-CM

## 2020-06-25 IMAGING — MR MR CERVICAL SPINE W/O CM
5 series · 41 of 48 positions shown · non-contrast
Comparison: Prior radiograph from [DATE]

CLINICAL DATA: Initial evaluation for cervical radiculopathy.

EXAM:
MRI CERVICAL SPINE WITHOUT CONTRAST
TECHNIQUE: Multiplanar, multisequence MR imaging of the cervical spine was
performed. No intravenous contrast was administered.

[Series 2: T2 · sagittal · 3.0mm · 0.69mm/px · 6 of 13 slices shown (1 of 2)]
[im 1/13]
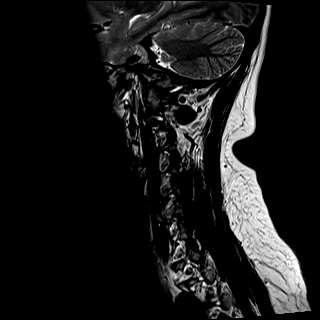
[im 3/13]
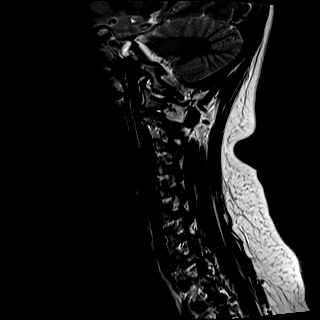
[im 5/13]
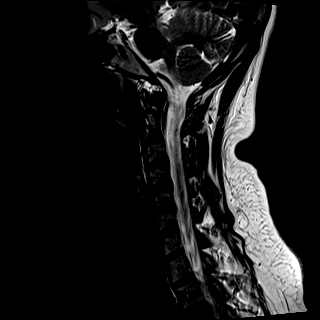
[im 8/13]
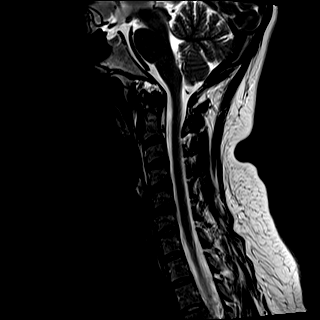
[im 10/13]
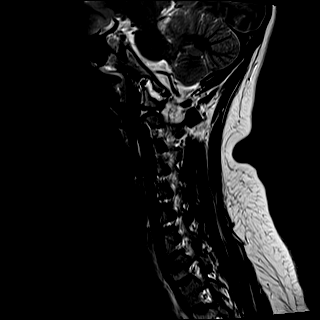
[im 13/13]
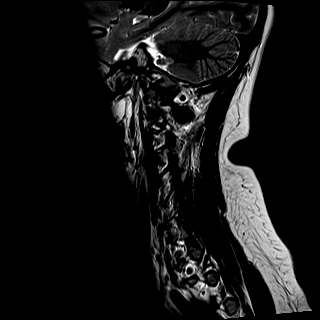

[Series 3: T1 · sagittal · 3.0mm · 0.86mm/px · 7 of 13 slices shown]
[im 1/13]
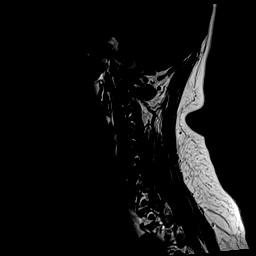
[im 3/13]
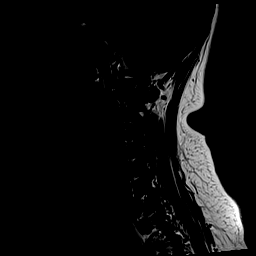
[im 5/13]
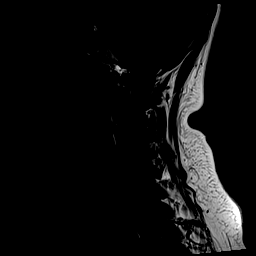
[im 7/13]
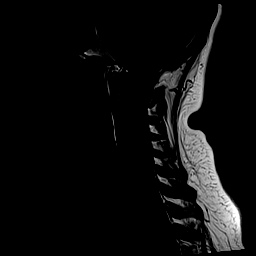
[im 9/13]
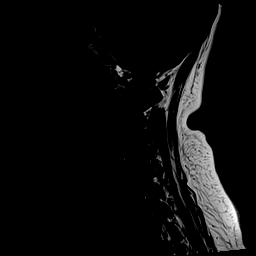
[im 11/13]
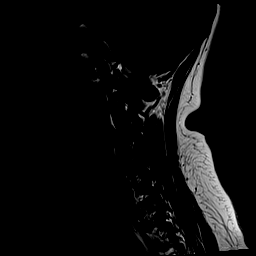
[im 13/13]
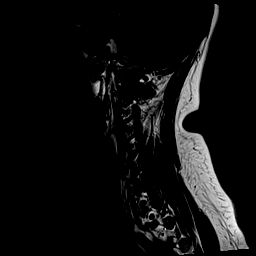

[Series 4: STIR · sagittal · 3.0mm · 0.69mm/px · 7 of 13 slices shown]
[im 1/13]
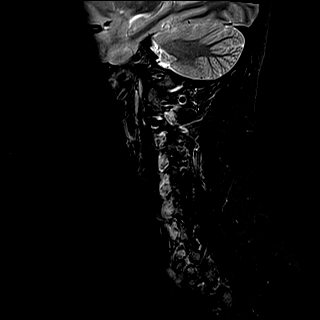
[im 3/13]
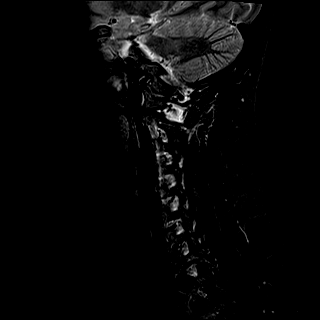
[im 5/13]
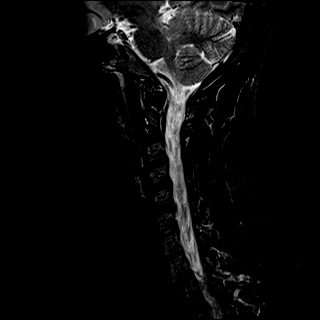
[im 7/13]
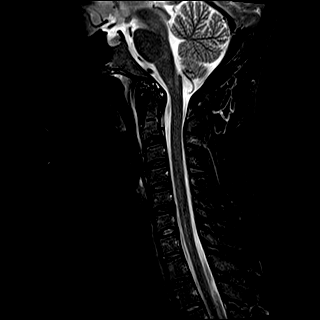
[im 9/13]
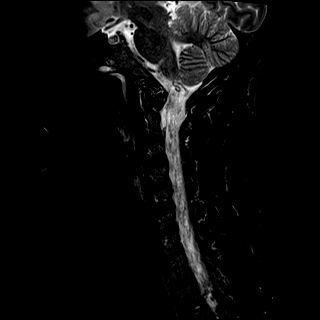
[im 11/13]
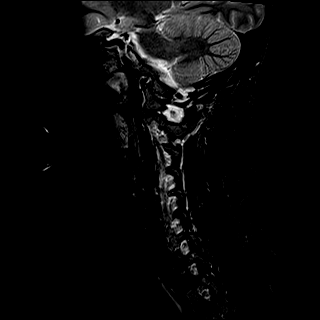
[im 13/13]
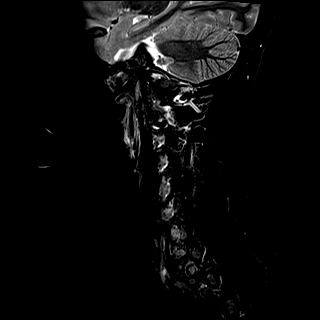

[Series 5: T2 · axial · 3.0mm · 0.62mm/px · z∈[-203,-105]mm · 13 of 27 slices shown (2 of 2)]
[im 1/27]
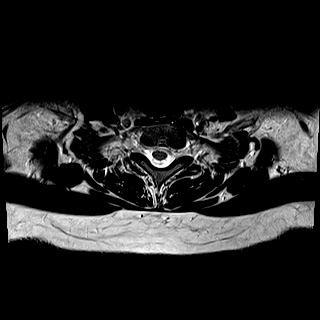
[im 3/27]
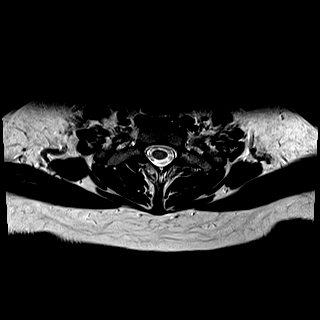
[im 5/27]
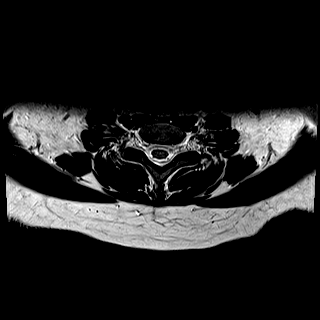
[im 7/27]
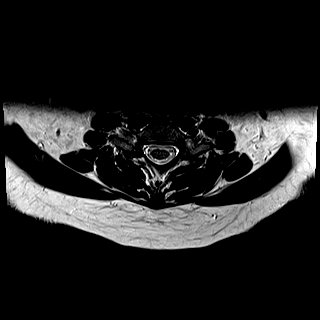
[im 9/27]
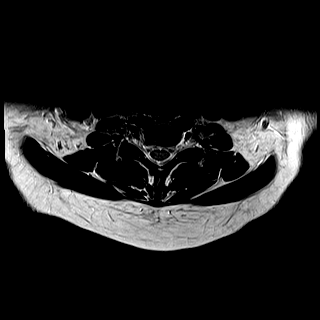
[im 11/27]
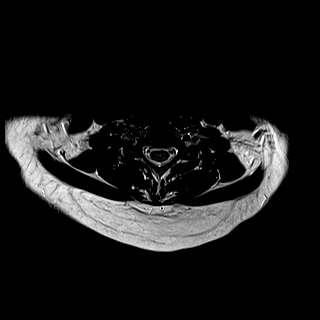
[im 13/27]
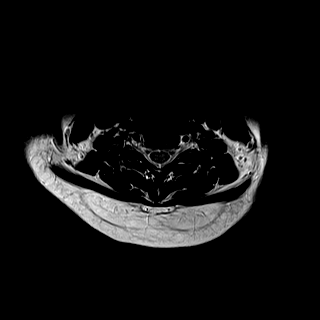
[im 15/27]
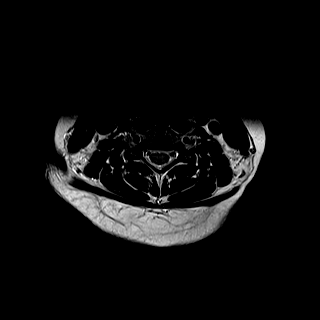
[im 17/27]
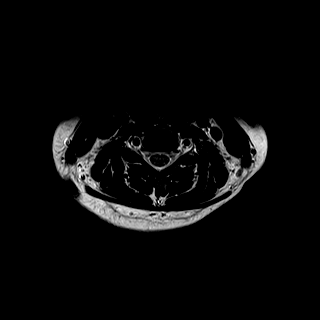
[im 19/27]
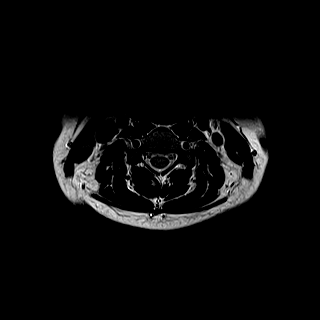
[im 21/27]
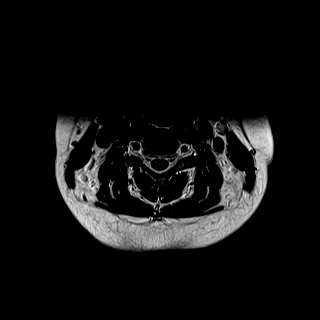
[im 23/27]
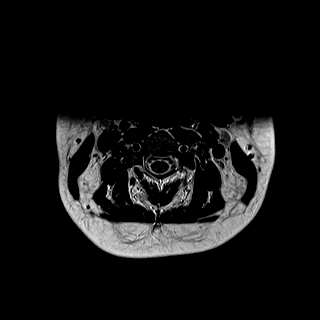
[im 27/27]
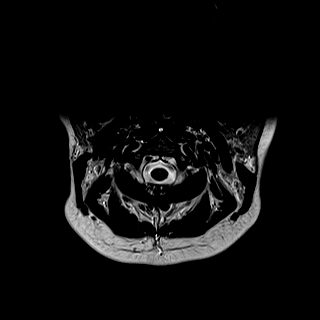

[Series 6: mpgr ax · axial · 3.0mm · 0.35mm/px · z∈[-195,-97]mm · 8 of 27 slices shown]
[im 1/27]
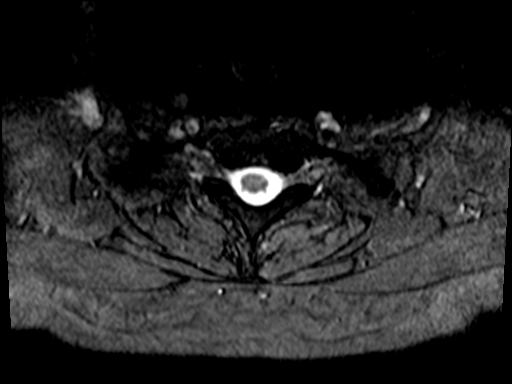
[im 5/27]
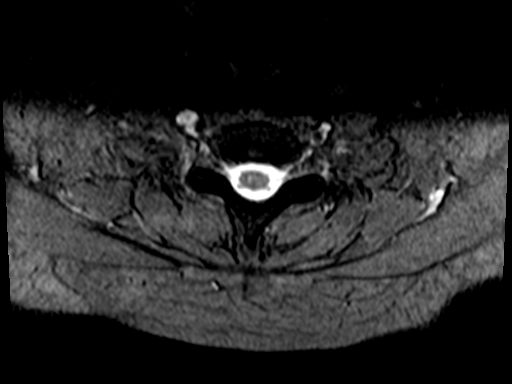
[im 9/27]
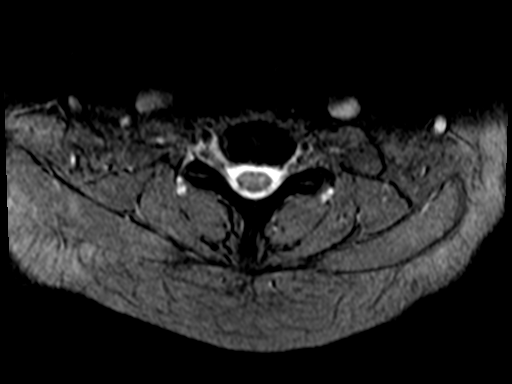
[im 13/27]
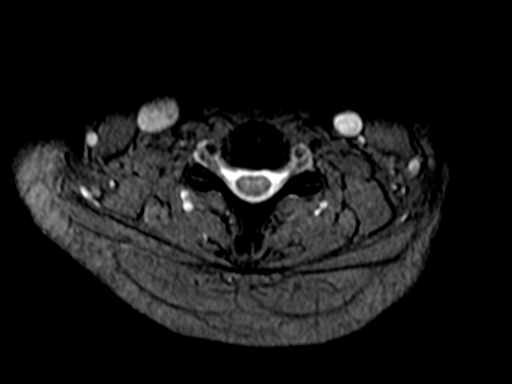
[im 15/27]
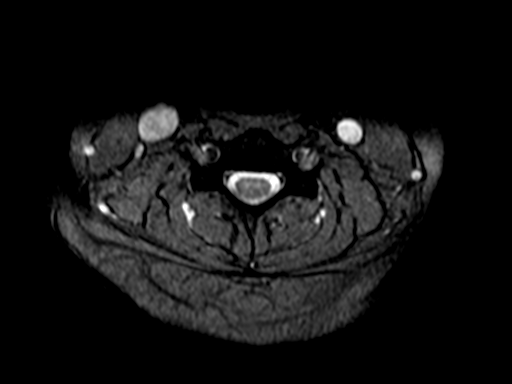
[im 19/27]
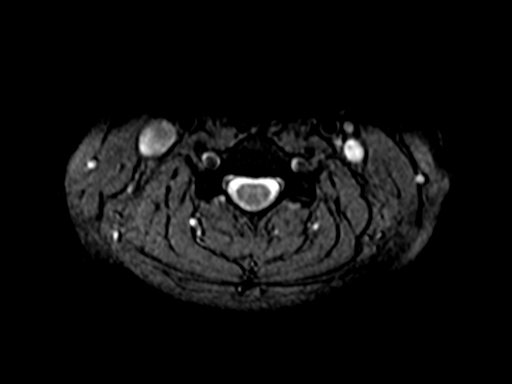
[im 23/27]
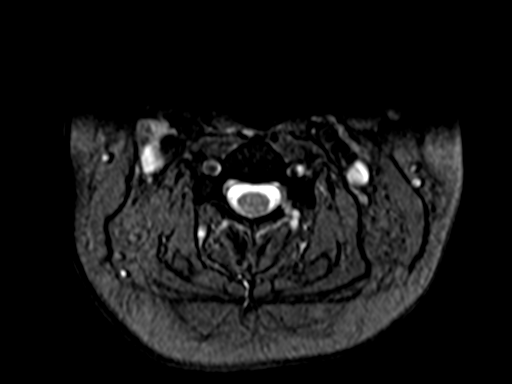
[im 27/27]
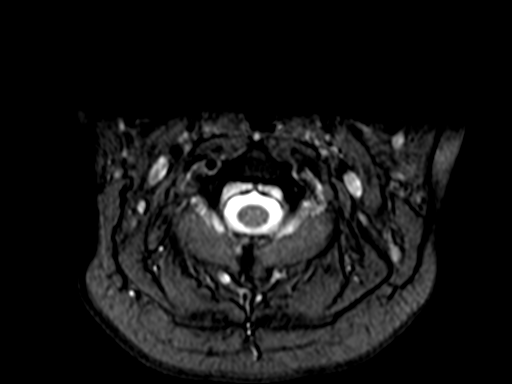

[41 of 48 positions shown; findings below may reference images not displayed]

FINDINGS: Alignment: Straightening of the normal cervical lordosis. No
listhesis.

Vertebrae: Vertebral body height well maintained without acute or
chronic fracture. Bone marrow signal intensity within normal limits.
No discrete or worrisome osseous lesions. No abnormal marrow edema.

Cord: Normal signal morphology.

Posterior Fossa, vertebral arteries, paraspinal tissues: Visualized
brain and posterior fossa within normal limits. Craniocervical
junction normal. Paraspinous and prevertebral soft tissues within
normal limits. Normal intravascular flow voids seen within the
vertebral arteries bilaterally.

Disc levels:

C2-C3: Unremarkable.

C3-C4:  Unremarkable.

C4-C5:  Unremarkable.

C5-C6: Small right paracentral disc protrusion indents the right
ventral thecal sac, contacting and minimally flattening the right
ventral cord (series 6, image 16). The ventral right C6 nerve root
could be affected. No significant spinal stenosis. Foramina remain
patent.

C6-C7:  Unremarkable.

C7-T1:  Unremarkable.

Visualized upper thoracic spine demonstrates no significant finding.
IMPRESSION: 1. Small right paracentral disc protrusion at C5-6, contacting and
mildly flattening the right hemi cord. The ventral right C6 nerve
root could be affected.
2. Otherwise unremarkable and normal MRI of the cervical spine.

## 2020-06-25 IMAGING — MR MR HEAD W/O CM
10 series · 48 of 48 positions shown · non-contrast
Comparison: None.

CLINICAL DATA: Initial evaluation for neck pain radiating to the
head, facial pain.

EXAM:
MRI HEAD WITHOUT CONTRAST
TECHNIQUE: Multiplanar, multiecho pulse sequences of the brain and surrounding
structures were obtained without intravenous contrast.

[Series 2: DWI · axial · 3.0mm · 1.20mm/px · z∈[-74,+88]mm · 10 of 110 slices shown (1 of 4)]
[im 1/110]
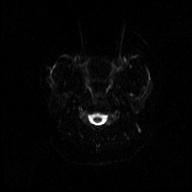
[im 13/110]
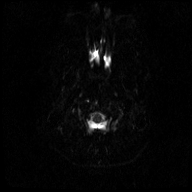
[im 25/110]
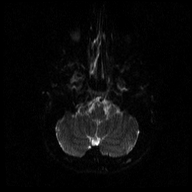
[im 37/110]
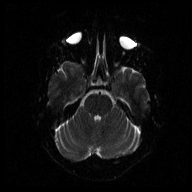
[im 49/110]
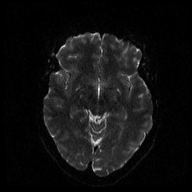
[im 61/110]
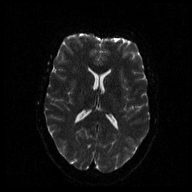
[im 73/110]
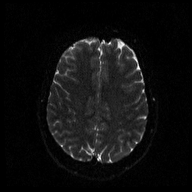
[im 85/110]
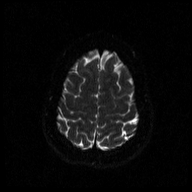
[im 97/110]
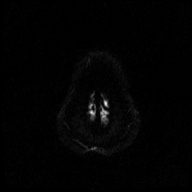
[im 110/110]
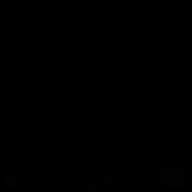

[Series 3: DWI · axial · 3.0mm · 1.20mm/px · z∈[-74,+88]mm · 4 of 55 slices shown (2 of 4)]
[im 1/55]
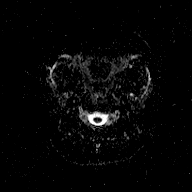
[im 19/55]
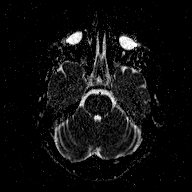
[im 37/55]
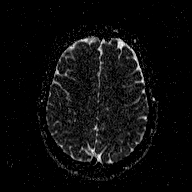
[im 55/55]
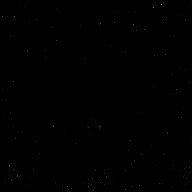

[Series 4: DWI · coronal · 3.0mm · 1.15mm/px · 7 of 94 slices shown (3 of 4)]
[im 1/94]
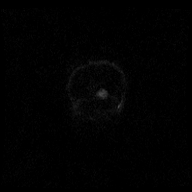
[im 16/94]
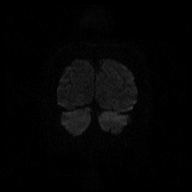
[im 32/94]
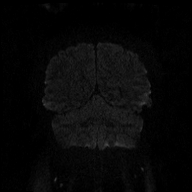
[im 47/94]
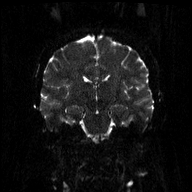
[im 63/94]
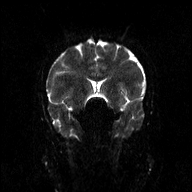
[im 78/94]
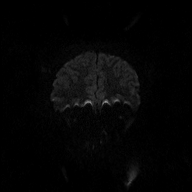
[im 94/94]
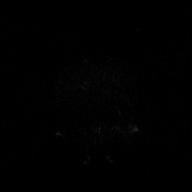

[Series 5: DWI · coronal · 3.0mm · 1.15mm/px · 4 of 47 slices shown (4 of 4)]
[im 1/47]
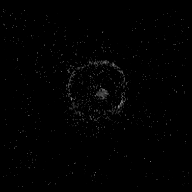
[im 16/47]
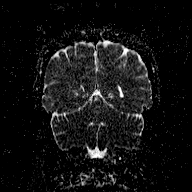
[im 31/47]
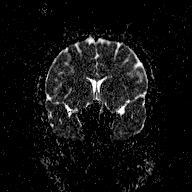
[im 47/47]
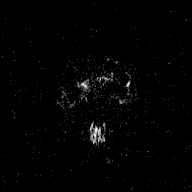

[Series 6: T1 · sagittal · 5.0mm · 0.45mm/px · 2 of 23 slices shown (1 of 2)]
[im 1/23]
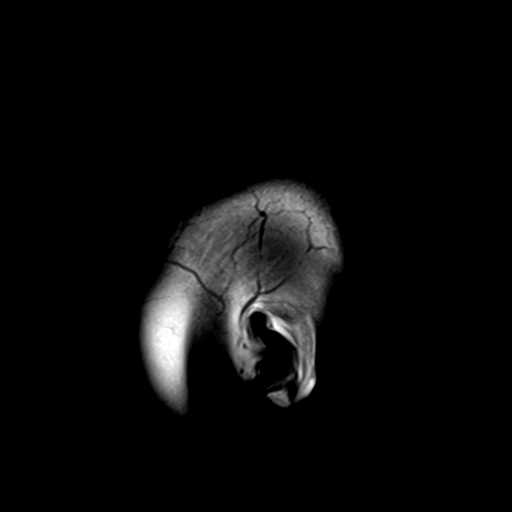
[im 23/23]
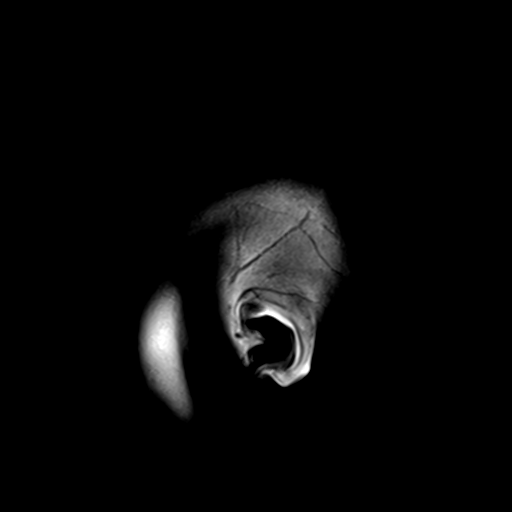

[Series 7: T2 · axial · 5.0mm · 0.72mm/px · z∈[-72,+82]mm · 2 of 23 slices shown (1 of 3)]
[im 1/23]
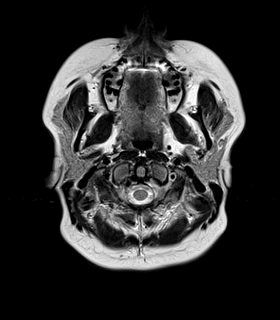
[im 23/23]
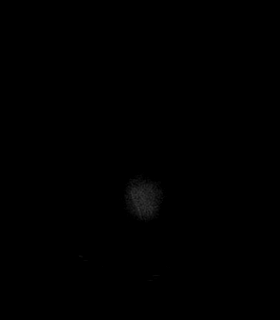

[Series 8: FLAIR · axial · 3.0mm · 0.45mm/px · z∈[-76,+86]mm · 4 of 55 slices shown]
[im 1/55]
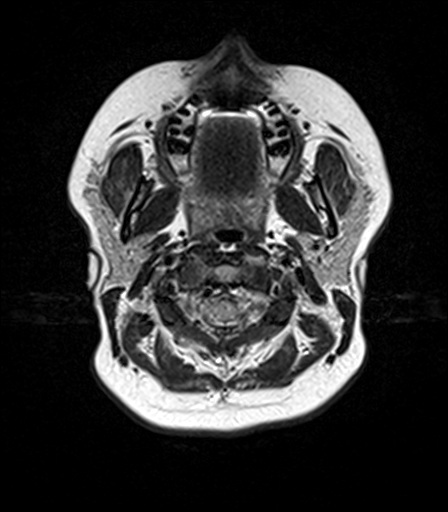
[im 19/55]
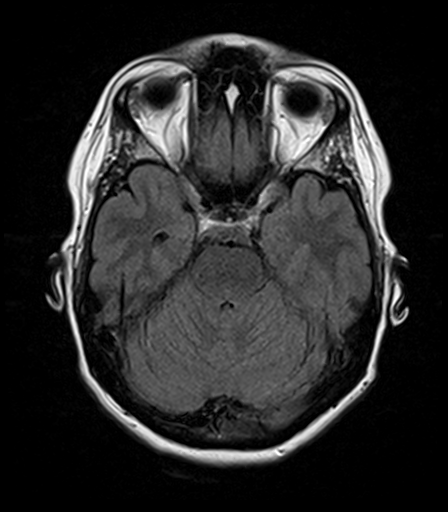
[im 37/55]
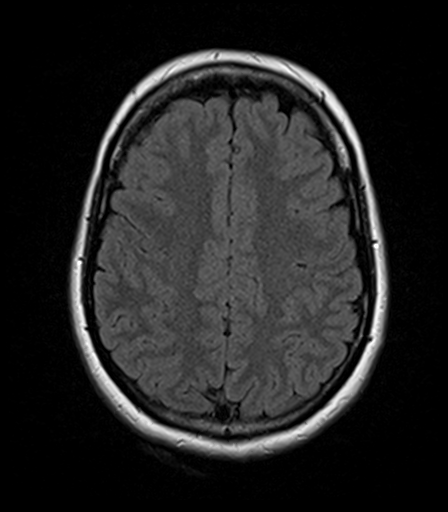
[im 55/55]
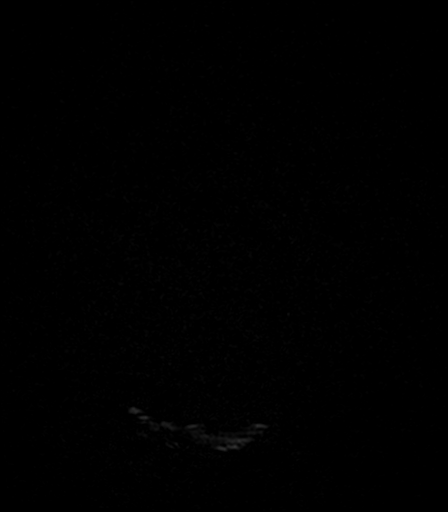

[Series 9: T2 · axial · 5.0mm · 0.72mm/px · z∈[-72,+82]mm · 2 of 23 slices shown (2 of 3)]
[im 1/23]
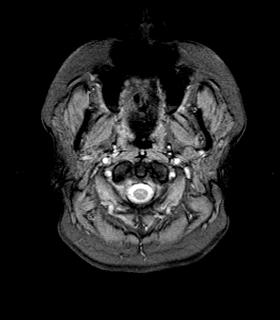
[im 23/23]
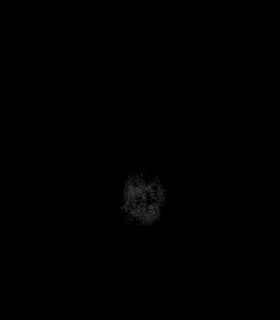

[Series 10: T1 · axial · 1.0mm · 1.00mm/px · z∈[-67,+76]mm · 11 of 144 slices shown (2 of 2)]
[im 1/144]
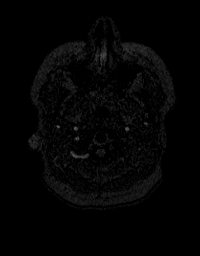
[im 15/144]
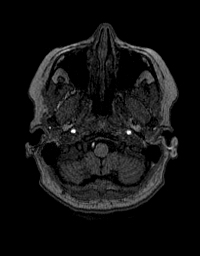
[im 29/144]
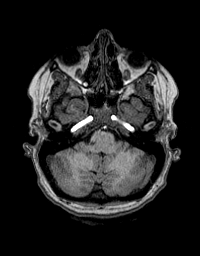
[im 43/144]
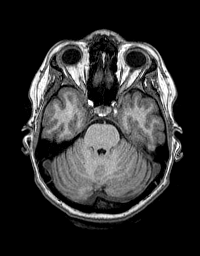
[im 58/144]
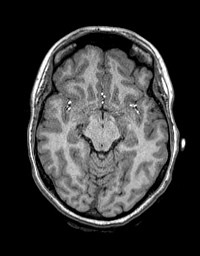
[im 72/144]
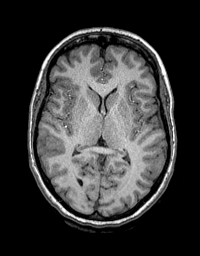
[im 86/144]
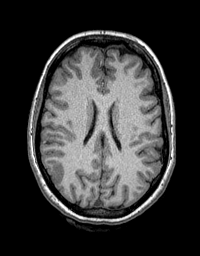
[im 101/144]
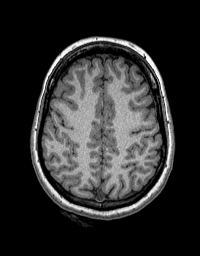
[im 115/144]
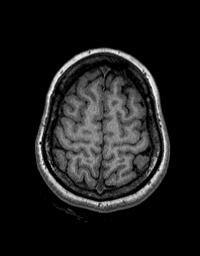
[im 129/144]
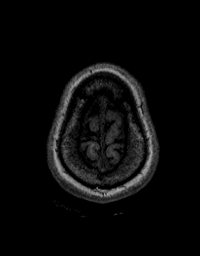
[im 144/144]
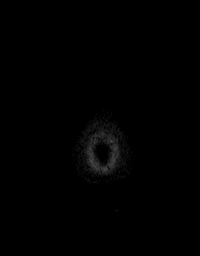

[Series 11: T2 · coronal · 5.0mm · 0.43mm/px · 2 of 29 slices shown (3 of 3)]
[im 1/29]
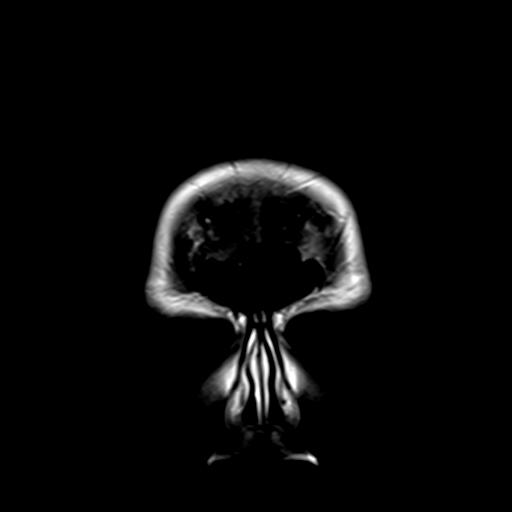
[im 29/29]
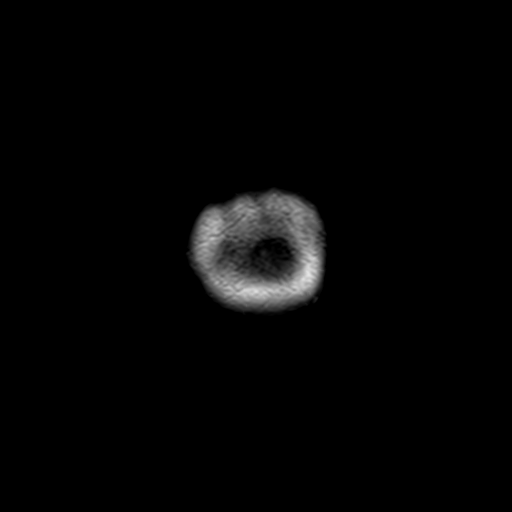

[48 of 48 positions shown; findings below may reference images not displayed]

FINDINGS: Brain: Cerebral volume within normal limits for patient age. No
focal parenchymal signal abnormality identified.

No abnormal foci of restricted diffusion to suggest acute or
subacute ischemia. Gray-white matter differentiation well
maintained. No encephalomalacia to suggest chronic infarction. No
foci of susceptibility artifact to suggest acute or chronic
intracranial hemorrhage.

No mass lesion, midline shift or mass effect. No hydrocephalus. No
extra-axial fluid collection. Major dural sinuses are grossly
patent.

Pituitary gland and suprasellar region are normal. Midline
structures intact and normal.

Vascular: Major intracranial vascular flow voids well maintained and
normal in appearance.

Skull and upper cervical spine: Craniocervical junction normal.
Visualized upper cervical spine within normal limits. Bone marrow
signal intensity normal. No scalp soft tissue abnormality.

Sinuses/Orbits: Globes and orbital soft tissues within normal
limits.

Paranasal sinuses are clear. No mastoid effusion. Inner ear
structures normal.

Other: None.
IMPRESSION: Normal brain MRI. No findings to explain patient's symptoms
identified.

## 2020-06-26 ENCOUNTER — Encounter: Payer: Self-pay | Admitting: Family Medicine

## 2020-06-28 DIAGNOSIS — M9902 Segmental and somatic dysfunction of thoracic region: Secondary | ICD-10-CM | POA: Diagnosis not present

## 2020-06-28 DIAGNOSIS — M542 Cervicalgia: Secondary | ICD-10-CM | POA: Diagnosis not present

## 2020-06-28 DIAGNOSIS — M546 Pain in thoracic spine: Secondary | ICD-10-CM | POA: Diagnosis not present

## 2020-06-28 DIAGNOSIS — M9901 Segmental and somatic dysfunction of cervical region: Secondary | ICD-10-CM | POA: Diagnosis not present

## 2020-07-03 DIAGNOSIS — M542 Cervicalgia: Secondary | ICD-10-CM | POA: Diagnosis not present

## 2020-07-03 DIAGNOSIS — M9901 Segmental and somatic dysfunction of cervical region: Secondary | ICD-10-CM | POA: Diagnosis not present

## 2020-07-03 DIAGNOSIS — M9902 Segmental and somatic dysfunction of thoracic region: Secondary | ICD-10-CM | POA: Diagnosis not present

## 2020-07-03 DIAGNOSIS — M546 Pain in thoracic spine: Secondary | ICD-10-CM | POA: Diagnosis not present

## 2020-07-10 DIAGNOSIS — M9901 Segmental and somatic dysfunction of cervical region: Secondary | ICD-10-CM | POA: Diagnosis not present

## 2020-07-10 DIAGNOSIS — M546 Pain in thoracic spine: Secondary | ICD-10-CM | POA: Diagnosis not present

## 2020-07-10 DIAGNOSIS — M9902 Segmental and somatic dysfunction of thoracic region: Secondary | ICD-10-CM | POA: Diagnosis not present

## 2020-07-10 DIAGNOSIS — M542 Cervicalgia: Secondary | ICD-10-CM | POA: Diagnosis not present

## 2020-07-12 DIAGNOSIS — R309 Painful micturition, unspecified: Secondary | ICD-10-CM | POA: Diagnosis not present

## 2020-07-12 DIAGNOSIS — N7689 Other specified inflammation of vagina and vulva: Secondary | ICD-10-CM | POA: Diagnosis not present

## 2020-07-25 DIAGNOSIS — M542 Cervicalgia: Secondary | ICD-10-CM | POA: Diagnosis not present

## 2020-07-25 DIAGNOSIS — M546 Pain in thoracic spine: Secondary | ICD-10-CM | POA: Diagnosis not present

## 2020-07-25 DIAGNOSIS — M9902 Segmental and somatic dysfunction of thoracic region: Secondary | ICD-10-CM | POA: Diagnosis not present

## 2020-07-25 DIAGNOSIS — M9901 Segmental and somatic dysfunction of cervical region: Secondary | ICD-10-CM | POA: Diagnosis not present

## 2020-08-02 DIAGNOSIS — M9902 Segmental and somatic dysfunction of thoracic region: Secondary | ICD-10-CM | POA: Diagnosis not present

## 2020-08-02 DIAGNOSIS — M9901 Segmental and somatic dysfunction of cervical region: Secondary | ICD-10-CM | POA: Diagnosis not present

## 2020-08-02 DIAGNOSIS — M546 Pain in thoracic spine: Secondary | ICD-10-CM | POA: Diagnosis not present

## 2020-08-02 DIAGNOSIS — M542 Cervicalgia: Secondary | ICD-10-CM | POA: Diagnosis not present

## 2020-09-05 DIAGNOSIS — M9901 Segmental and somatic dysfunction of cervical region: Secondary | ICD-10-CM | POA: Diagnosis not present

## 2020-09-05 DIAGNOSIS — M9902 Segmental and somatic dysfunction of thoracic region: Secondary | ICD-10-CM | POA: Diagnosis not present

## 2020-09-05 DIAGNOSIS — M542 Cervicalgia: Secondary | ICD-10-CM | POA: Diagnosis not present

## 2020-09-05 DIAGNOSIS — M546 Pain in thoracic spine: Secondary | ICD-10-CM | POA: Diagnosis not present

## 2020-12-06 DIAGNOSIS — Z0000 Encounter for general adult medical examination without abnormal findings: Secondary | ICD-10-CM | POA: Diagnosis not present

## 2021-01-17 ENCOUNTER — Encounter: Payer: Self-pay | Admitting: Physician Assistant

## 2021-01-17 ENCOUNTER — Telehealth (INDEPENDENT_AMBULATORY_CARE_PROVIDER_SITE_OTHER): Payer: Medicaid Other | Admitting: Physician Assistant

## 2021-01-17 DIAGNOSIS — R0981 Nasal congestion: Secondary | ICD-10-CM | POA: Diagnosis not present

## 2021-01-17 DIAGNOSIS — U071 COVID-19: Secondary | ICD-10-CM | POA: Diagnosis not present

## 2021-01-17 DIAGNOSIS — R051 Acute cough: Secondary | ICD-10-CM | POA: Diagnosis not present

## 2021-01-17 DIAGNOSIS — J3489 Other specified disorders of nose and nasal sinuses: Secondary | ICD-10-CM | POA: Diagnosis not present

## 2021-01-17 MED ORDER — HYDROCOD POLST-CPM POLST ER 10-8 MG/5ML PO SUER: 5.0000 mL | Freq: Two times a day (BID) | ORAL | 0 refills | Status: DC | PRN

## 2021-01-17 MED ORDER — FLUTICASONE PROPIONATE 50 MCG/ACT NA SUSP: 2.0000 | Freq: Every day | NASAL | 2 refills | Status: DC

## 2021-01-17 MED ORDER — METHYLPREDNISOLONE 4 MG PO TBPK: ORAL_TABLET | ORAL | 0 refills | Status: DC

## 2021-01-17 NOTE — Progress Notes (Signed)
..Virtual Visit via Video Note  I connected with Kelli Gill on 01/22/21 at  2:00 PM EST by a video enabled telemedicine application and verified that I am speaking with the correct person using two identifiers.  Location: Patient: home Provider: clinic  .Marland KitchenParticipating in visit:  Patient: Kelli Gill Provider: Iran Planas PA-C Provider in training: Haroldine Laws PA-S   I discussed the limitations of evaluation and management by telemedicine and the availability of in person appointments. The patient expressed understanding and agreed to proceed.  History of Present Illness: Patient is a 25 year old female who tested positive for COVID yesterday.  Her symptom onset was 5 days ago with congestion, runny nose, sore throat and cough.  She is also had a persistent headache and sinus pressure.  Her cough and headache are the worst symptoms.  She has been taking DayQuil and NyQuil with little relief.  It is right before Thanksgiving holiday and she was hoping to get some help with symptom relief.  She denies any shortness of breath or difficulty breathing, leg pain or swelling. She does not have vaccine.   .. Active Ambulatory Problems    Diagnosis Date Noted   Plantar fasciitis 08/11/2012   Eczematous dermatitis 09/07/2012   Left foot pain 12/18/2015   Obesity 01/15/2016   Palpitations 05/15/2016   Anxiety and depression 11/29/2016   Globus sensation 11/03/2019   Radiculitis of right cervical region 12/22/2019   Neck pain 12/27/2019   Resolved Ambulatory Problems    Diagnosis Date Noted   BRADYCARDIA 10/23/2009   ABDOMINAL PAIN 11/23/2009   Ingrown toenail 12/18/2015   Postnasal drip with bronchitis 11/29/2016   Diarrhea 11/03/2019   Past Medical History:  Diagnosis Date   Depression    GERD (gastroesophageal reflux disease)     Observations/Objective: No acute distress No labored breathing Dry cough on video Fatigued and pale appearance  .Marland Kitchen Today's Vitals   There is  no height or weight on file to calculate BMI.   Assessment and Plan: Marland KitchenMarland KitchenAimee was seen today for covid positive.  Diagnoses and all orders for this visit:  COVID-19 virus infection -     methylPREDNISolone (MEDROL DOSEPAK) 4 MG TBPK tablet; Take as directed by package insert.  Acute cough -     fluticasone (FLONASE) 50 MCG/ACT nasal spray; Place 2 sprays into both nostrils daily. -     methylPREDNISolone (MEDROL DOSEPAK) 4 MG TBPK tablet; Take as directed by package insert. -     chlorpheniramine-HYDROcodone ([place]) 10-8 MG/5ML SUER; Take 5 mLs by mouth every 12 (twelve) hours as needed.  Nasal congestion -     fluticasone (FLONASE) 50 MCG/ACT nasal spray; Place 2 sprays into both nostrils daily. -     methylPREDNISolone (MEDROL DOSEPAK) 4 MG TBPK tablet; Take as directed by package insert.  Sinus pressure -     fluticasone (FLONASE) 50 MCG/ACT nasal spray; Place 2 sprays into both nostrils daily. -     methylPREDNISolone (MEDROL DOSEPAK) 4 MG TBPK tablet; Take as directed by package insert.  Out of window and low risk to start antiviral.  She has not had covid vaccine.  Discussed symptoms of covid and how to treat symptomatically.  Added prednisone, cough syrup, flonase.  Discussed quarantine and when to come out at 5 days and wear a mask for another 5 days or 10 days.  Discussed red flag symptoms and when to return to ED/UC.  Follow up as needed.    Follow Up Instructions:    I discussed  the assessment and treatment plan with the patient. The patient was provided an opportunity to ask questions and all were answered. The patient agreed with the plan and demonstrated an understanding of the instructions.   The patient was advised to call back or seek an in-person evaluation if the symptoms worsen or if the condition fails to improve as anticipated.    Tandy Gaw, PA-C

## 2021-09-20 ENCOUNTER — Ambulatory Visit: Payer: Medicaid Other | Admitting: Family Medicine

## 2021-09-20 VITALS — BP 112/58 | HR 57 | Ht 65.0 in | Wt 206.0 lb

## 2021-09-20 DIAGNOSIS — R591 Generalized enlarged lymph nodes: Secondary | ICD-10-CM

## 2021-09-20 DIAGNOSIS — F32A Depression, unspecified: Secondary | ICD-10-CM | POA: Diagnosis not present

## 2021-09-20 DIAGNOSIS — F419 Anxiety disorder, unspecified: Secondary | ICD-10-CM

## 2021-09-20 MED ORDER — SERTRALINE HCL 25 MG PO TABS: 25.0000 mg | ORAL_TABLET | Freq: Every day | ORAL | 0 refills | Status: DC

## 2021-09-20 NOTE — Progress Notes (Signed)
Established Patient Office Visit  Subjective   Patient ID: Kelli Gill, female    DOB: 1995-03-31  Age: 26 y.o. MRN: [id number]  Chief Complaint  Patient presents with   Anxiety         HPI  Follow-up anxiety/depression -she really feels like her anxiety in particular has really hit an all-time high.  She says every day she struggles with just most invasive thoughts of worst-case scenario of things that might happen to her or her children.  It makes it difficult some days to get motivated and get out of the house and do things that she needs to do.  She is not currently taking any medications but has briefly taken mood medications in the past.  She does feel like sometimes the anxiety affects her sleep.  She says that is her quiet time at night and so her mind starts racing.  She says normally if she can keep herself busy she does well.  Would like to discuss weight loss.  She is struggled since having children.  She cannot be as physically active as she would like to be.  Has a lymph node in the left axilla that she would like to be evaluated.  She noticed it about 3 days ago after having noticed a little bit of razor burn.  The razor burn is cleared up and she does feel like the lymph node is a little smaller but she would like me to take a look at it today.    ROS    Objective:     BP (!) 112/58   Pulse (!) 57   Ht 5\' 5"  (1.651 m)   Wt 206 lb (93.4 kg)   SpO2 97%   BMI 34.28 kg/m    Physical Exam Vitals and nursing note reviewed.  Constitutional:      Appearance: She is well-developed.  HENT:     Head: Normocephalic and atraumatic.  Cardiovascular:     Rate and Rhythm: Normal rate and regular rhythm.     Heart sounds: Normal heart sounds.  Pulmonary:     Effort: Pulmonary effort is normal.     Breath sounds: Normal breath sounds.  Skin:    General: Skin is warm and dry.     Comments: Small approximately 1 cm lymph node in the left axilla just below the  crease in the axilla.  No erythema.  No open wound or ingrown hairs.  Neurological:     Mental Status: She is alert and oriented to person, place, and time.  Psychiatric:        Behavior: Behavior normal.      No results found for any visits on 09/20/21.    The ASCVD Risk score (Arnett DK, et al., 2019) failed to calculate for the following reasons:   The 2019 ASCVD risk score is only valid for ages 56 to 58    Assessment & Plan:   Problem List Items Addressed This Visit       Other   Anxiety and depression - Primary    We discussed options I do agree that getting back on medication could actually be really helpful for her in addition to doing some CBT.  She is open to doing both.  So we will start with sertraline and then I will see her back in about 3 weeks to go ahead and place referral for therapy with 74 as well.      Relevant Medications   sertraline (ZOLOFT)  25 MG tablet   Other Relevant Orders   Ambulatory referral to Behavioral Health   Other Visit Diagnoses     Lymphadenopathy           Left axillary lymph node-just noticed it 3 days ago but she feels like it is already getting a little smaller likely triggered by razor burn.  If not continuing to resolve over the next week or 2 then please let us know and we will work-up further with ultrasound and mammo.  Exam most consistent with benign reactive lymph node  Return in about 3 weeks (around 10/11/2021) for New start medication.    Nani Gasser, MD

## 2021-09-20 NOTE — Assessment & Plan Note (Signed)
We discussed options I do agree that getting back on medication could actually be really helpful for her in addition to doing some CBT.  She is open to doing both.  So we will start with sertraline and then I will see her back in about 3 weeks to go ahead and place referral for therapy with [facility] as well.

## 2021-09-22 ENCOUNTER — Encounter (INDEPENDENT_AMBULATORY_CARE_PROVIDER_SITE_OTHER): Payer: Medicaid Other | Admitting: Family Medicine

## 2021-09-22 DIAGNOSIS — L249 Irritant contact dermatitis, unspecified cause: Secondary | ICD-10-CM

## 2021-09-24 NOTE — Telephone Encounter (Signed)
I spent 5 total minutes of online digital evaluation and management services in this patient-initiated request for online care. 

## 2021-09-25 DIAGNOSIS — L249 Irritant contact dermatitis, unspecified cause: Secondary | ICD-10-CM | POA: Insufficient documentation

## 2021-09-25 MED ORDER — TRIAMCINOLONE ACETONIDE 0.5 % EX CREA: 1.0000 | TOPICAL_CREAM | Freq: Two times a day (BID) | CUTANEOUS | 3 refills | Status: DC

## 2021-09-25 NOTE — Assessment & Plan Note (Signed)
Adding topical triamcinolone °

## 2021-09-25 NOTE — Addendum Note (Signed)
Addended by: Monica Becton on: 09/25/2021 02:13 PM   Modules accepted: Orders

## 2021-09-27 ENCOUNTER — Telehealth: Payer: Self-pay | Admitting: Family Medicine

## 2021-09-27 DIAGNOSIS — F419 Anxiety disorder, unspecified: Secondary | ICD-10-CM

## 2021-09-27 NOTE — Telephone Encounter (Signed)
Initial referral to [facility] declined.  New referral placed.    Orders Placed This Encounter  Procedures   Ambulatory referral to Behavioral Health    Referral Priority:   Routine    Referral Type:   Psychiatric    Referral Reason:   Specialty Services Required    Requested Specialty:   Behavioral Health    Number of Visits Requested:   1

## 2021-10-12 ENCOUNTER — Telehealth: Payer: Medicaid Other | Admitting: Family Medicine

## 2021-10-12 ENCOUNTER — Other Ambulatory Visit: Payer: Self-pay | Admitting: Family Medicine

## 2021-10-12 VITALS — Ht 66.0 in | Wt 206.0 lb

## 2021-10-12 DIAGNOSIS — F32A Depression, unspecified: Secondary | ICD-10-CM

## 2021-10-12 DIAGNOSIS — R591 Generalized enlarged lymph nodes: Secondary | ICD-10-CM

## 2021-10-12 DIAGNOSIS — F419 Anxiety disorder, unspecified: Secondary | ICD-10-CM

## 2021-10-12 DIAGNOSIS — R21 Rash and other nonspecific skin eruption: Secondary | ICD-10-CM | POA: Diagnosis not present

## 2021-10-12 MED ORDER — SERTRALINE HCL 50 MG PO TABS: 50.0000 mg | ORAL_TABLET | Freq: Every day | ORAL | 3 refills | Status: DC

## 2021-10-12 NOTE — Progress Notes (Signed)
   Established Patient Office Visit  Subjective   Patient ID: Kelli Gill, female    DOB: 1995-08-28  Age: 26 y.o. MRN: [id number]  Chief Complaint  Patient presents with   Follow-up    Medication Possible increase in dose  Weight loss medication consult     HPI  F/U anxiety and depression - we changed med to sertraline 3 weeks ago.  Also placed ref for therapy. We have sent referral to 2 offices and they don't accept her insurance so last fax has been to [facility].    F/U swollen LN on the axilla.  She says it is getting smaller but wanted me to be able to check it so she has made an appointment for next week.  Also had some rash and peeling skin around her thumbs.  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  She is going to have me take a look at that on Tuesday when she comes in as well.    ROS    Objective:     Ht 5\' 6"  (1.676 m)   Wt 206 lb (93.4 kg)   BMI 33.25 kg/m    Physical Exam   No results found for any visits on 10/12/21.    The ASCVD Risk score (Arnett DK, et al., 2019) failed to calculate for the following reasons:   The 2019 ASCVD risk score is only valid for ages 69 to 64    Assessment & Plan:   Problem List Items Addressed This Visit       Other   Anxiety and depression - Primary    So far doing really well with the sertraline 25 mg.  Discussed options.  We will increase to 50 mg.  Plan to follow back up in about 8 weeks.  We can make any adjustments needed at that time.  Still working on trying to get her in with a therapist/counselor.  Unfortunately the first 2 places that we have tried do not take her insurance so we are trying to get her in with [facility].  Encouraged her to give 76 a call back next week if she does not hear anything back about the referral.      Relevant Medications   sertraline (ZOLOFT) 50 MG tablet   Other Visit Diagnoses     Lymphadenopathy       Rash of finger          F/U LN  next week.    Rash - can see if steroid creams helping.   No follow-ups on file.   I spent 22 minutes on the day of the encounter to include pre-visit record review, face-to-face time with the patient and post visit ordering of test.   [place], MD

## 2021-10-12 NOTE — Assessment & Plan Note (Addendum)
So far doing really well with the sertraline 25 mg.  Discussed options.  We will increase to 50 mg.  Plan to follow back up in about 8 weeks.  We can make any adjustments needed at that time.  Still working on trying to get her in with a therapist/counselor.  Unfortunately the first 2 places that we have tried do not take her insurance so we are trying to get her in with [facility].  Encouraged her to give [place] a call back next week if she does not hear anything back about the referral.

## 2021-10-16 ENCOUNTER — Encounter: Payer: Self-pay | Admitting: Family Medicine

## 2021-10-16 ENCOUNTER — Ambulatory Visit: Payer: Medicaid Other | Admitting: Family Medicine

## 2021-10-16 VITALS — BP 107/67 | HR 69 | Wt 205.0 lb

## 2021-10-16 DIAGNOSIS — R2232 Localized swelling, mass and lump, left upper limb: Secondary | ICD-10-CM

## 2021-10-16 DIAGNOSIS — L249 Irritant contact dermatitis, unspecified cause: Secondary | ICD-10-CM

## 2021-10-16 DIAGNOSIS — N644 Mastodynia: Secondary | ICD-10-CM | POA: Diagnosis not present

## 2021-10-16 DIAGNOSIS — Z23 Encounter for immunization: Secondary | ICD-10-CM | POA: Diagnosis not present

## 2021-10-16 NOTE — Progress Notes (Addendum)
Established Patient Office Visit  Subjective   Patient ID: Kelli Gill, female    DOB: 02/21/1996  Age: 26 y.o. MRN: [id number]  Chief Complaint  Patient presents with   Breast Pain    HPI  Is here today to have [place] take a look at a lump in the left axilla.  Its been there for a little over a month.  We had looked at it about a month ago she does feel like it is a little smaller than it was previously.  She says around the time that she mention the left axillary lump about a month ago she started getting a little bit of a burning sensation in her left breast.  Now she feels it in both breast.  She says they also still feel extremely sore to touch.  It does feel better if she wears a tight fitting sports bra.  She does not drink caffeine she does not drink soda.  She is not had any discharge from the nipples.  No lumps in the breast tissue.  Also wanted me to look at the rash around her fingernails.  She is a nail tech for a living and has noticed that she is getting a lot of erythema and skin peeling and irritation around the border of the nails.  Has been applying topical triamcinolone cream.    ROS    Objective:     BP 107/67   Pulse 69   Wt 205 lb (93 kg)   SpO2 99%   BMI 33.09 kg/m    Physical Exam Vitals reviewed. Exam conducted with a chaperone present.  Constitutional:      Appearance: She is well-developed.  HENT:     Head: Normocephalic and atraumatic.  Eyes:     Conjunctiva/sclera: Conjunctivae normal.  Cardiovascular:     Rate and Rhythm: Normal rate.  Pulmonary:     Effort: Pulmonary effort is normal.  Chest:  Breasts:    Right: Normal. No nipple discharge or skin change.     Left: Normal. No nipple discharge or skin change.     Comments: Some fullness in the left axilla Lymphadenopathy:     Upper Body:     Right upper body: No axillary adenopathy.     Left upper body: No axillary adenopathy.  Skin:    General: Skin is dry.     Coloration: Skin  is not pale.  Neurological:     Mental Status: She is alert and oriented to person, place, and time.  Psychiatric:        Behavior: Behavior normal.      No results found for any visits on 10/16/21.    The ASCVD Risk score (Arnett DK, et al., 2019) failed to calculate for the following reasons:   The 2019 ASCVD risk score is only valid for ages 63 to 6    Assessment & Plan:   Problem List Items Addressed This Visit   None Visit Diagnoses     Lump of axilla, left    -  Primary   Relevant Orders   MM Digital Diagnostic Bilat   US BREAST COMPLETE UNI LEFT INC AXILLA   US BREAST COMPLETE UNI RIGHT INC AXILLA   Breast pain       Relevant Orders   MM Digital Diagnostic Bilat   US BREAST COMPLETE UNI LEFT INC AXILLA   US BREAST COMPLETE UNI RIGHT INC AXILLA   Irritant dermatitis       Relevant Orders  Ambulatory referral to Allergy   Need for immunization against influenza       Relevant Orders   Flu Vaccine QUAD 76mo+IM (Fluarix, Fluzone & Alfiuria Quad PF) (Completed)       Bilateral breast pain-recommend diastolic stick mammogram for further work-up.  Being that it is bilateral consider other causes such as hormones etc. might even want to check into getting refitted for new bra wear to see if maybe her size has changed and she might need extra support.  She avoids caffeine which can sometimes be a trigger for breast pain.  Left axillary lump-possible lymph node.  Recommend diagnostic ultrasound for further work-up.   No follow-ups on file.    Nani Gasser, MD

## 2021-10-30 DIAGNOSIS — R2232 Localized swelling, mass and lump, left upper limb: Secondary | ICD-10-CM | POA: Diagnosis not present

## 2021-10-30 DIAGNOSIS — N644 Mastodynia: Secondary | ICD-10-CM | POA: Diagnosis not present

## 2021-10-31 ENCOUNTER — Encounter: Payer: Self-pay | Admitting: Family Medicine

## 2021-11-01 ENCOUNTER — Telehealth (INDEPENDENT_AMBULATORY_CARE_PROVIDER_SITE_OTHER): Payer: Medicaid Other | Admitting: Family Medicine

## 2021-11-01 DIAGNOSIS — R928 Other abnormal and inconclusive findings on diagnostic imaging of breast: Secondary | ICD-10-CM

## 2021-11-01 DIAGNOSIS — N6322 Unspecified lump in the left breast, upper inner quadrant: Secondary | ICD-10-CM

## 2021-11-01 NOTE — Telephone Encounter (Signed)
I am fine either way.  If they want to go ahead and move on getting her scheduled that is fine or if she wants to wait and talk to me until tomorrow and then we put the order in that I fine.  I am not sure I am understanding exactly what she is asking for.  Also want a make sure that her Pap smear screening is also up-to-date for cervical cancer screening.  Can we see if she is gone somewhere to have that done as well.

## 2021-11-01 NOTE — Progress Notes (Signed)
Virtual Visit via Video Note  I connected with Kelli Gill on 11/01/21 at  1:00 PM EDT by a video enabled telemedicine application and verified that I am speaking with the correct person using two identifiers.   I discussed the limitations of evaluation and management by telemedicine and the availability of in person appointments. The patient expressed understanding and agreed to proceed.  Patient location: at home Provider location: in office  Subjective:    CC:   Chief Complaint  Patient presents with   Results    HPI: We have referred her initially for breast pain and a lump in the left axilla.  Targeted left breast ultrasound was performed at [facility] on September 5, 2 days ago.  There was fullness in the left axilla but no discrete palpable mass.  They did see a heterogeneous hypoechoic mass with margins at the 2 o'clock position approximately 2 cm from the nipple.  Measuring 1.4 x 1.3 x 0.8 cm.  They discussed options including repeat ultrasound with close follow-up or possible biopsy or excision.  No fam hx of Br, Colon, and Ovarian Ca   Past medical history, Surgical history, Family history not pertinant except as noted below, Social history, Allergies, and medications have been entered into the medical record, reviewed, and corrections made.    Objective:    General: Speaking clearly in complete sentences without any shortness of breath.  Alert and oriented x3.  Normal judgment. No apparent acute distress.    Impression and Recommendations:    Problem List Items Addressed This Visit   None Visit Diagnoses     Abnormal ultrasound of breast    -  Primary   Relevant Orders   Ambulatory referral to General Surgery   Mass of upper inner quadrant of left breast       Relevant Orders   Ambulatory referral to General Surgery      We did review the ultrasound results together and the options that she was given.  I think any of them are reasonable I do not  favor 1 over the other based on the fact that she is young, otherwise healthy, no worrisome features to the [place] spiculation etc., and no family history of breast colon or ovarian cancer.  But based on her diagnosis of generalized anxiety I think it would be good for her mental health to go ahead and have a lumpectomy performed.  Because we did discuss that even if a biopsy performed and it comes back negative she will still likely need to be followed for a period of time.  She does have her Pap smear scheduled for later this fall.  We will go ahead and make referral to ambulatory general surgery for breast lumpectomy.  Orders Placed This Encounter  Procedures   Ambulatory referral to General Surgery    Referral Priority:   Routine    Referral Type:   Surgical    Referral Reason:   Specialty Services Required    Requested Specialty:   General Surgery    Number of Visits Requested:   1    No orders of the defined types were placed in this encounter.    I discussed the assessment and treatment plan with the patient. The patient was provided an opportunity to ask questions and all were answered. The patient agreed with the plan and demonstrated an understanding of the instructions.   The patient was advised to call back or seek an in-person evaluation if the  symptoms worsen or if the condition fails to improve as anticipated.  I spent 20 minutes on the day of the encounter to include pre-visit record review, face-to-face time with the patient and post visit ordering of test.   Nani Gasser, MD

## 2021-11-06 ENCOUNTER — Other Ambulatory Visit: Payer: Self-pay | Admitting: Family Medicine

## 2021-11-06 DIAGNOSIS — F32A Depression, unspecified: Secondary | ICD-10-CM

## 2021-11-21 DIAGNOSIS — N6322 Unspecified lump in the left breast, upper inner quadrant: Secondary | ICD-10-CM | POA: Diagnosis not present

## 2021-12-10 DIAGNOSIS — N920 Excessive and frequent menstruation with regular cycle: Secondary | ICD-10-CM | POA: Diagnosis not present

## 2021-12-10 DIAGNOSIS — Z124 Encounter for screening for malignant neoplasm of cervix: Secondary | ICD-10-CM | POA: Diagnosis not present

## 2021-12-10 DIAGNOSIS — R102 Pelvic and perineal pain: Secondary | ICD-10-CM | POA: Diagnosis not present

## 2021-12-10 DIAGNOSIS — Z01419 Encounter for gynecological examination (general) (routine) without abnormal findings: Secondary | ICD-10-CM | POA: Diagnosis not present

## 2021-12-10 DIAGNOSIS — L918 Other hypertrophic disorders of the skin: Secondary | ICD-10-CM | POA: Diagnosis not present

## 2021-12-10 LAB — HM PAP SMEAR: HM Pap smear: NEGATIVE

## 2021-12-13 DIAGNOSIS — N6322 Unspecified lump in the left breast, upper inner quadrant: Secondary | ICD-10-CM | POA: Diagnosis not present

## 2021-12-13 DIAGNOSIS — N6032 Fibrosclerosis of left breast: Secondary | ICD-10-CM | POA: Diagnosis not present

## 2021-12-13 DIAGNOSIS — N6022 Fibroadenosis of left breast: Secondary | ICD-10-CM | POA: Diagnosis not present

## 2021-12-13 DIAGNOSIS — N644 Mastodynia: Secondary | ICD-10-CM | POA: Diagnosis not present

## 2021-12-18 ENCOUNTER — Encounter: Payer: Self-pay | Admitting: Family Medicine

## 2021-12-20 DIAGNOSIS — J302 Other seasonal allergic rhinitis: Secondary | ICD-10-CM | POA: Diagnosis not present

## 2021-12-20 DIAGNOSIS — L239 Allergic contact dermatitis, unspecified cause: Secondary | ICD-10-CM | POA: Diagnosis not present

## 2022-01-03 DIAGNOSIS — J302 Other seasonal allergic rhinitis: Secondary | ICD-10-CM | POA: Diagnosis not present

## 2022-01-03 DIAGNOSIS — L239 Allergic contact dermatitis, unspecified cause: Secondary | ICD-10-CM | POA: Diagnosis not present

## 2022-01-08 DIAGNOSIS — L239 Allergic contact dermatitis, unspecified cause: Secondary | ICD-10-CM | POA: Diagnosis not present

## 2022-01-21 ENCOUNTER — Encounter: Payer: Self-pay | Admitting: Family Medicine

## 2022-01-21 DIAGNOSIS — M542 Cervicalgia: Secondary | ICD-10-CM

## 2022-01-21 DIAGNOSIS — R09A2 Foreign body sensation, throat: Secondary | ICD-10-CM

## 2022-01-22 NOTE — Telephone Encounter (Signed)
Pap results requested.

## 2022-01-22 NOTE — Addendum Note (Signed)
Addended by: Chalmers Cater on: 01/22/2022 09:08 AM   Modules accepted: Orders

## 2022-01-22 NOTE — Addendum Note (Signed)
Addended by: Nani Gasser D on: 01/22/2022 09:09 AM   Modules accepted: Orders

## 2022-01-22 NOTE — Telephone Encounter (Signed)
You.  Order signed.  But see if we can abstract the Pap smear viewed online.

## 2022-01-29 ENCOUNTER — Encounter: Payer: Self-pay | Admitting: Family Medicine

## 2022-01-29 NOTE — Progress Notes (Signed)
Negative for intraepithelial lesion or malignancy.  

## 2022-06-06 ENCOUNTER — Ambulatory Visit (INDEPENDENT_AMBULATORY_CARE_PROVIDER_SITE_OTHER): Payer: Medicaid Other | Admitting: Family Medicine

## 2022-06-06 ENCOUNTER — Encounter: Payer: Self-pay | Admitting: Family Medicine

## 2022-06-06 VITALS — BP 96/54 | HR 85 | Ht 65.0 in | Wt 216.0 lb

## 2022-06-06 DIAGNOSIS — R21 Rash and other nonspecific skin eruption: Secondary | ICD-10-CM

## 2022-06-06 DIAGNOSIS — Z833 Family history of diabetes mellitus: Secondary | ICD-10-CM | POA: Diagnosis not present

## 2022-06-06 DIAGNOSIS — R635 Abnormal weight gain: Secondary | ICD-10-CM

## 2022-06-06 DIAGNOSIS — Z0000 Encounter for general adult medical examination without abnormal findings: Secondary | ICD-10-CM | POA: Diagnosis not present

## 2022-06-06 MED ORDER — HYDROCORTISONE VALERATE 0.2 % EX CREA: 1.0000 | TOPICAL_CREAM | Freq: Two times a day (BID) | CUTANEOUS | 2 refills | Status: DC

## 2022-06-06 NOTE — Progress Notes (Signed)
Complete physical exam  Patient: Kelli Gill   DOB: 08/29/1995   26 y.o. Female  MRN: [id number]  Subjective:    Chief Complaint  Patient presents with   Annual Exam    Kelli Gill is a 27 y.o. female who presents today for a complete physical exam. She reports consuming a general diet.  Doing home pilates. Using MyFitness Pal   She generally feels well.  She does not have additional problems to discuss today.   She is also concerned about her weight.  Despite exercising much more regularly, eating a healthy diet, cutting out any sugary beverages and eating mostly vegetables and using my fitness pal to help restrict her calories as a guide she is still gaining weight.  She does have a family history of diabetes and is concerned about that.  She has an job Geophysicist/field seismologistteaching nail technicians.  And has really been enjoying it.  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  She feels like mentally she is in a better place staying busy has been helpful and just really changing her frame of mind in regards to creating her own happiness has been really impactful as well.  She does have a couple of red spots on her skin and 1 on her left upper chest area and 1 on her mid forehead she says they get a little red and scaly.  There is a family history of eczema.   Most recent fall risk assessment:    06/06/2022   11:02 AM  Fall Risk   Falls in the past year? 0  Number falls in past yr: 0  Injury with Fall? 0  Risk for fall due to : No Fall Risks  Follow up Falls evaluation completed     Most recent depression screenings:    06/06/2022   11:02 AM 10/16/2021   10:43 AM  PHQ 2/9 Scores  PHQ - 2 Score 0 0        Patient Care Team: Agapito GamesMetheney, Knoah Nedeau D, MD as PCP - General   Outpatient Medications Prior to Visit  Medication Sig   fluticasone (FLONASE) 50 MCG/ACT nasal spray Place 2 sprays into both nostrils daily.   gabapentin (NEURONTIN) 100 MG capsule 1 tab po QHS x 2 nights and then increase to 1  tab PO BID. After one week can increase to 1 in AM and 2 QHS   hydrOXYzine (ATARAX/VISTARIL) 25 MG tablet Take 25 mg by mouth daily as needed.   meloxicam (MOBIC) 15 MG tablet One tab PO qAM with a meal for 2 weeks, then daily prn pain.   sertraline (ZOLOFT) 50 MG tablet TAKE 1 TABLET BY MOUTH EVERY DAY   triamcinolone cream (KENALOG) 0.5 % Apply 1 Application topically 2 (two) times daily. To affected areas.   No facility-administered medications prior to visit.    ROS        Objective:     BP (!) 96/54   Pulse 85   Ht 5\' 5"  (1.651 m)   Wt 216 lb (98 kg)   LMP 05/11/2022 (Exact Date)   SpO2 100%   Breastfeeding No   BMI 35.94 kg/m    Physical Exam Vitals and nursing note reviewed. Exam conducted with a chaperone present.  Constitutional:      Appearance: She is well-developed.  HENT:     Head: Normocephalic and atraumatic.     Right Ear: External ear normal.     Left Ear: External ear normal.     Nose: Nose  normal.  Eyes:     Conjunctiva/sclera: Conjunctivae normal.     Pupils: Pupils are equal, round, and reactive to light.  Neck:     Thyroid: No thyromegaly.  Cardiovascular:     Rate and Rhythm: Normal rate and regular rhythm.     Heart sounds: Normal heart sounds.  Pulmonary:     Effort: Pulmonary effort is normal.     Breath sounds: Normal breath sounds. No wheezing.  Chest:     Chest wall: No mass.  Breasts:    Right: Normal.     Left: Normal.       Comments: Scar is well-healed. Abdominal:     General: Bowel sounds are normal.     Palpations: Abdomen is soft.  Musculoskeletal:     Cervical back: Neck supple.  Lymphadenopathy:     Cervical: No cervical adenopathy.     Upper Body:     Right upper body: No axillary or pectoral adenopathy.     Left upper body: No axillary or pectoral adenopathy.  Skin:    General: Skin is warm and dry.  Neurological:     Mental Status: She is alert and oriented to person, place, and time.  Psychiatric:         Behavior: Behavior normal.      No results found for any visits on 06/06/22.     Assessment & Plan:    Routine Health Maintenance and Physical Exam  Immunization History  Administered Date(s) Administered   Influenza,inj,Quad PF,6+ Mos 10/16/2021   Influenza-Unspecified 12/27/2014   Rho (D) Immune Globulin 05/21/2015   Tdap 02/24/2015    Health Maintenance  Topic Date Due   Pneumococcal Vaccine 28-63 Years old (1 of 2 - PCV) Never done   Hepatitis C Screening  Never done   COVID-19 Vaccine (1) 06/22/2023 (Originally 03/09/1996)   INFLUENZA VACCINE  09/26/2022   PAP-Cervical Cytology Screening  12/10/2024   PAP SMEAR-Modifier  12/10/2024   DTaP/Tdap/Td (2 - Td or Tdap) 02/23/2025   HIV Screening  Completed   HPV VACCINES  Aged Out    Discussed health benefits of physical activity, and encouraged her to engage in regular exercise appropriate for her age and condition.  Problem List Items Addressed This Visit   None Visit Diagnoses     Wellness examination    -  Primary   Relevant Orders   COMPLETE METABOLIC PANEL WITH GFR   Lipid Panel w/reflex Direct LDL   TSH   Hemoglobin A1c   Family history of diabetes mellitus       Relevant Orders   COMPLETE METABOLIC PANEL WITH GFR   Lipid Panel w/reflex Direct LDL   TSH   Hemoglobin A1c   Rash       Relevant Medications   hydrocortisone valerate cream (WESTCORT) 0.2 %   Weight gain, abnormal           Keep up a regular exercise program and make sure you are eating a healthy diet Try to eat 4 servings of dairy a day, or if you are lactose intolerant take a calcium with vitamin D daily.  Your vaccines are up to date.   The rash on the left upper chest looks more consistent with eczema.  The lesion on her forehead most looks like a little sebaceous inflammation.  Will treat both areas with a low-dose topical steroid cream for 5 to 7 days to see if symptoms improve.  Continue to hydrate the skin well.  She  says she has  noticed the areas since she moved into their new home and they do have well water.  Abnormal weight gain-despite really putting concerted effort into healthy diet regular exercise and calorie restriction she is still gaining weight.  Will check additional labs including thyroid levels and check for diabetes.  She is open to trying one of the new FDA approved weight loss medications.  She will be switching insurances at the end of the month and so encouraged her to check with her new insurance on coverage and to see what might be available.  Also offered to refer her to [facility] which is here locally and they do a fantastic more comprehensive approach to weight management.  No follow-ups on file.     Nani Gasser, MD

## 2022-06-07 LAB — COMPLETE METABOLIC PANEL WITH GFR
AG Ratio: 1.9 (calc) (ref 1.0–2.5)
ALT: 14 U/L (ref 6–29)
AST: 17 U/L (ref 10–30)
Albumin: 4.5 g/dL (ref 3.6–5.1)
Alkaline phosphatase (APISO): 66 U/L (ref 31–125)
BUN: 8 mg/dL (ref 7–25)
CO2: 25 mmol/L (ref 20–32)
Calcium: 9.5 mg/dL (ref 8.6–10.2)
Chloride: 104 mmol/L (ref 98–110)
Creat: 0.67 mg/dL (ref 0.50–0.96)
Globulin: 2.4 g/dL (calc) (ref 1.9–3.7)
Glucose, Bld: 85 mg/dL (ref 65–99)
Potassium: 4.1 mmol/L (ref 3.5–5.3)
Sodium: 140 mmol/L (ref 135–146)
Total Bilirubin: 0.3 mg/dL (ref 0.2–1.2)
Total Protein: 6.9 g/dL (ref 6.1–8.1)
eGFR: 122 mL/min/{1.73_m2} (ref >=60)

## 2022-06-07 LAB — TSH: TSH: 2.51 mIU/L

## 2022-06-07 LAB — LIPID PANEL W/REFLEX DIRECT LDL
Cholesterol: 126 mg/dL (ref <200)
HDL: 49 mg/dL — ABNORMAL LOW (ref >=50)
LDL Cholesterol (Calc): 61 mg/dL (calc)
Non-HDL Cholesterol (Calc): 77 mg/dL (calc) (ref <130)
Total CHOL/HDL Ratio: 2.6 (calc) (ref <5.0)
Triglycerides: 80 mg/dL (ref <150)

## 2022-06-07 LAB — HEMOGLOBIN A1C
Hgb A1c MFr Bld: 5.4 % of total Hgb (ref <5.7)
Mean Plasma Glucose: 108 mg/dL
eAG (mmol/L): 6 mmol/L

## 2022-06-09 ENCOUNTER — Encounter: Payer: Self-pay | Admitting: Family Medicine

## 2022-06-09 DIAGNOSIS — Z6836 Body mass index (BMI) 36.0-36.9, adult: Secondary | ICD-10-CM

## 2022-06-09 DIAGNOSIS — R635 Abnormal weight gain: Secondary | ICD-10-CM

## 2022-06-09 NOTE — Progress Notes (Signed)
Your lab work is within acceptable range and there are no concerning findings.   ?

## 2022-06-10 NOTE — Telephone Encounter (Signed)
Orders Placed This Encounter  °Procedures  ° Amb Ref to Medical Weight Management  °  Referral Priority:   Routine  °  Referral Type:   Consultation  °  Number of Visits Requested:   1  ° ° °

## 2022-07-27 ENCOUNTER — Encounter: Payer: Self-pay | Admitting: Family Medicine

## 2022-07-27 DIAGNOSIS — Z7689 Persons encountering health services in other specified circumstances: Secondary | ICD-10-CM

## 2022-07-27 DIAGNOSIS — R635 Abnormal weight gain: Secondary | ICD-10-CM

## 2022-07-27 DIAGNOSIS — Z6836 Body mass index (BMI) 36.0-36.9, adult: Secondary | ICD-10-CM

## 2022-07-30 MED ORDER — SEMAGLUTIDE-WEIGHT MANAGEMENT 0.5 MG/0.5ML ~~LOC~~ SOAJ: 0.5000 mg | SUBCUTANEOUS | 0 refills | Status: DC

## 2022-07-30 MED ORDER — SEMAGLUTIDE-WEIGHT MANAGEMENT 0.25 MG/0.5ML ~~LOC~~ SOAJ: 0.2500 mg | SUBCUTANEOUS | 0 refills | Status: DC

## 2022-07-30 NOTE — Telephone Encounter (Signed)
Meds ordered this encounter  Medications   Semaglutide-Weight Management 0.25 MG/0.5ML SOAJ    Sig: Inject 0.25 mg into the skin once a week for 28 days.    Dispense:  2 mL    Refill:  0   Semaglutide-Weight Management 0.5 MG/0.5ML SOAJ    Sig: Inject 0.5 mg into the skin once a week for 28 days.    Dispense:  2 mL    Refill:  0

## 2022-08-10 ENCOUNTER — Telehealth: Payer: Self-pay

## 2022-08-10 NOTE — Telephone Encounter (Signed)
Initiated Prior authorization ZOX:[id number] 0.25MG /0.5ML auto-injectors Via: Covermymeds Case/Key:[id number] Status: approved  as of 08/10/22 Reason: Authorization Expiration Date: March 11, 2023. Notified Pt via: Mychart

## 2022-08-22 ENCOUNTER — Encounter: Payer: 59 | Admitting: Bariatrics

## 2022-09-10 ENCOUNTER — Other Ambulatory Visit: Payer: Self-pay | Admitting: Family Medicine

## 2022-09-10 DIAGNOSIS — Z7689 Persons encountering health services in other specified circumstances: Secondary | ICD-10-CM

## 2022-09-10 DIAGNOSIS — Z6836 Body mass index (BMI) 36.0-36.9, adult: Secondary | ICD-10-CM

## 2022-09-10 DIAGNOSIS — R635 Abnormal weight gain: Secondary | ICD-10-CM

## 2022-09-12 NOTE — Telephone Encounter (Signed)
Called patient, LVM to call office to schedule appt for weight management, thanks.

## 2022-09-12 NOTE — Telephone Encounter (Signed)
Please call pt she MUST come in for f/u for weight management for refills of Wegovy. Will refill for this month for the next dose.

## 2022-09-18 NOTE — Telephone Encounter (Signed)
Called patient, LVM to call office to schedule weight management appt, thanks.

## 2022-09-18 NOTE — Telephone Encounter (Signed)
Patient scheduled for 09/24/22, thanks.

## 2022-09-24 ENCOUNTER — Ambulatory Visit: Payer: 59 | Admitting: Family Medicine

## 2022-10-21 ENCOUNTER — Ambulatory Visit: Payer: 59 | Admitting: Family Medicine

## 2022-12-12 ENCOUNTER — Ambulatory Visit: Payer: 59 | Admitting: Family Medicine

## 2022-12-12 ENCOUNTER — Encounter: Payer: Self-pay | Admitting: Family Medicine

## 2022-12-12 VITALS — BP 113/76 | HR 84 | Ht 65.0 in | Wt 198.0 lb

## 2022-12-12 DIAGNOSIS — Z7689 Persons encountering health services in other specified circumstances: Secondary | ICD-10-CM | POA: Insufficient documentation

## 2022-12-12 DIAGNOSIS — F32A Depression, unspecified: Secondary | ICD-10-CM

## 2022-12-12 DIAGNOSIS — Z23 Encounter for immunization: Secondary | ICD-10-CM

## 2022-12-12 DIAGNOSIS — F419 Anxiety disorder, unspecified: Secondary | ICD-10-CM

## 2022-12-12 MED ORDER — SERTRALINE HCL 50 MG PO TABS
50.0000 mg | ORAL_TABLET | Freq: Every day | ORAL | 2 refills | Status: DC
Start: 2022-12-12 — End: 2023-03-10

## 2022-12-12 MED ORDER — WEGOVY 0.5 MG/0.5ML ~~LOC~~ SOAJ: 0.5000 mg | SUBCUTANEOUS | 1 refills | Status: DC

## 2022-12-12 MED ORDER — WEGOVY 0.25 MG/0.5ML ~~LOC~~ SOAJ: 0.2500 mg | SUBCUTANEOUS | 0 refills | Status: DC

## 2022-12-12 NOTE — Progress Notes (Signed)
Pt reports that she hasn't been taking the [facility] for the past 1.5 mos she said that it was causing her to feel really nauseated on the higher dosage. She would like to go back down to the 0.5mg 

## 2022-12-12 NOTE — Progress Notes (Signed)
Established Patient Office Visit  Subjective   Patient ID: Kelli Gill, female    DOB: August 09, 1995  Age: 27 y.o. MRN: [id number]  Chief Complaint  Patient presents with   Weight Check    HPI  She was able to start the [facility] and July.  She has had a fair amount of nausea with it and when she got to the 1 mg dose she really did not eat anything for almost 4 days and felt really bad.  She tried to get the old dose filled but could not.  She tried to make an appointment here but then missed her appointment.  She is continue to try to work portioning and getting an adequate protein and vegetable intake.  She is been doing some more meal prep.  She is been doing some band exercises 1 day/week and doing a 1-1/2 to 2 mile walk on Sundays.    ROS    Objective:     BP 113/76   Pulse 84   Ht 5\' 5"  (1.651 m)   Wt 198 lb (89.8 kg)   SpO2 96%   BMI 32.95 kg/m    Physical Exam Vitals and nursing note reviewed.  Constitutional:      Appearance: Normal appearance.  HENT:     Head: Normocephalic and atraumatic.  Eyes:     Conjunctiva/sclera: Conjunctivae normal.  Cardiovascular:     Rate and Rhythm: Normal rate and regular rhythm.  Pulmonary:     Effort: Pulmonary effort is normal.     Breath sounds: Normal breath sounds.  Skin:    General: Skin is warm and dry.  Neurological:     Mental Status: She is alert.  Psychiatric:        Mood and Affect: Mood normal.      No results found for any visits on 12/12/22.    The ASCVD Risk score (Arnett DK, et al., 2019) failed to calculate for the following reasons:   The 2019 ASCVD risk score is only valid for ages 77 to 35    Assessment & Plan:   Problem List Items Addressed This Visit       Other   Encounter for weight management    Visit #: 2 Starting Weight:218 lbs   Current weight: 198 lbs  Previous weight: 218 lbs  Change in weight: down 20 lbs  Goal weight: 165-170 lbs  Dietary goals: Continue to work on food  prep and getting in protein and not skipping meals. Exercise goals: add in extra work out day per week, for total of 3 per week.  Medication: Wegovy 0.25mg  to see if we can get the 0.25 mg covered again and then bump up to the 0.5 after 1 month.  I do not know if she will be able to tolerate a higher dose but she is really done great on the lower dose and is already lost 20 pounds. Follow-up and referrals: 7 weeks.         Relevant Medications   Semaglutide-Weight Management (WEGOVY) 0.25 MG/0.5ML SOAJ   Semaglutide-Weight Management (WEGOVY) 0.5 MG/0.5ML SOAJ (Start on 12/26/2022)   Anxiety and depression   Relevant Medications   sertraline (ZOLOFT) 50 MG tablet   Other Visit Diagnoses     Encounter for immunization    -  Primary   Relevant Orders   Flu vaccine trivalent PF, 6mos and older(Flulaval,Afluria,Fluarix,Fluzone) (Completed)       Return in about 7 weeks (around 01/30/2023) for Weight mgt.  Nani Gasser, MD

## 2022-12-12 NOTE — Assessment & Plan Note (Addendum)
Visit #: 2 Starting [id number]:782 lbs   Current weight: 198 lbs  Previous weight: 218 lbs  Change in weight: down 20 lbs  Goal weight: 165-170 lbs  Dietary goals: Continue to work on food prep and getting in protein and not skipping meals. Exercise goals: add in extra work out day per week, for total of 3 per week.  Medication: Wegovy 0.25mg  to see if we can get the 0.25 mg covered again and then bump up to the 0.5 after 1 month.  I do not know if she will be able to tolerate a higher dose but she is really done great on the lower dose and is already lost 20 pounds. Follow-up and referrals: 7 weeks.

## 2023-01-30 ENCOUNTER — Ambulatory Visit: Payer: 59 | Admitting: Family Medicine

## 2023-01-30 NOTE — Progress Notes (Unsigned)
   Established Patient Office Visit  Subjective   Patient ID: Kelli Gill, female    DOB: 1995/11/18  Age: 27 y.o. MRN: [id number]  No chief complaint on file.   HPI  F/U wt mgt -   {History (Optional):23778}  ROS    Objective:     There were no vitals taken for this visit. {Vitals History (Optional):23777}  Physical Exam   No results found for any visits on 01/30/23.  {Labs (Optional):23779}  The ASCVD Risk score (Arnett DK, et al., 2019) failed to calculate for the following reasons:   The 2019 ASCVD risk score is only valid for ages 38 to 32    Assessment & Plan:   Problem List Items Addressed This Visit       Other   Encounter for weight management - Primary    Visit #: 3 Starting Weight:218 lbs    Current weight:  Previous weight: 198 lbs  Change in weight:  Goal weight: 165-170 lbs  Dietary goals: Continue to work on food prep and getting in protein and not skipping meals. Exercise goals: add in extra work out day per week, for total of 3 per week.  Medication: [id number]   . Follow-up and referrals: 8 weeks.       No follow-ups on file.    Nani Gasser, MD

## 2023-01-30 NOTE — Assessment & Plan Note (Signed)
Visit #: 3 Starting Weight:218 lbs    Current weight:  Previous weight: 198 lbs  Change in weight:  Goal weight: 165-170 lbs  Dietary goals: Continue to work on food prep and getting in protein and not skipping meals. Exercise goals: add in extra work out day per week, for total of 3 per week.  Medication: GEXBMW   . Follow-up and referrals: 8 weeks.

## 2023-02-27 ENCOUNTER — Ambulatory Visit: Payer: 59 | Admitting: Family Medicine

## 2023-03-10 ENCOUNTER — Ambulatory Visit: Payer: 59 | Admitting: Family Medicine

## 2023-03-10 ENCOUNTER — Encounter: Payer: Self-pay | Admitting: Family Medicine

## 2023-03-10 VITALS — BP 105/72 | HR 103 | Ht 65.0 in | Wt 204.0 lb

## 2023-03-10 DIAGNOSIS — J019 Acute sinusitis, unspecified: Secondary | ICD-10-CM | POA: Diagnosis not present

## 2023-03-10 DIAGNOSIS — Z7689 Persons encountering health services in other specified circumstances: Secondary | ICD-10-CM

## 2023-03-10 MED ORDER — AZITHROMYCIN 250 MG PO TABS
ORAL_TABLET | ORAL | 0 refills | Status: AC
Start: 2023-03-10 — End: 2023-03-15

## 2023-03-10 NOTE — Progress Notes (Signed)
   Established Patient Office Visit  Subjective  Patient ID: Kelli Gill, female    DOB: 1995-11-13  Age: 28 y.o. MRN: [id number]  Chief Complaint  Patient presents with   Nasal Congestion   Sinusitis    HPI  She has had sinus congestion and cough for about a week.  A lot of sinus pressure she did run a low-grade temperature initially.  Then a couple of days ago she lost her voice for about 2 days and then felt like she was having some pain near her vocal cords.  She also just feels really tired.  She is no longer on the semaglutide  for weight loss she had such problems getting her medication consistently and so would have long periods without the medication and then get given a higher dose because this what was ordered and then get really sick feeling she also started to lose a lot of hair so at this point she is just stop the medication just plans on really working on lifestyle again.    ROS    Objective:     BP 105/72   Pulse (!) 103   Ht 5' 5 (1.651 m)   Wt 204 lb (92.5 kg)   SpO2 98%   BMI 33.95 kg/m    Physical Exam Vitals and nursing note reviewed.  Constitutional:      Appearance: Normal appearance.  HENT:     Head: Normocephalic and atraumatic.     Right Ear: Tympanic membrane, ear canal and external ear normal. There is no impacted cerumen.     Left Ear: Tympanic membrane, ear canal and external ear normal. There is no impacted cerumen.     Nose: Nose normal.     Mouth/Throat:     Pharynx: Oropharynx is clear. Posterior oropharyngeal erythema present.  Eyes:     Conjunctiva/sclera: Conjunctivae normal.  Cardiovascular:     Rate and Rhythm: Normal rate and regular rhythm.  Pulmonary:     Effort: Pulmonary effort is normal.     Breath sounds: Normal breath sounds.  Musculoskeletal:     Cervical back: Neck supple. No tenderness.  Lymphadenopathy:     Cervical: No cervical adenopathy.  Skin:    General: Skin is warm and dry.  Neurological:      Mental Status: She is alert and oriented to person, place, and time.  Psychiatric:        Mood and Affect: Mood normal.      No results found for any visits on 03/10/23.    The ASCVD Risk score (Arnett DK, et al., 2019) failed to calculate for the following reasons:   The 2019 ASCVD risk score is only valid for ages 54 to 65    Assessment & Plan:   Problem List Items Addressed This Visit       Other   Encounter for weight management   She has used my fitness pal before and was able to lose some weight with it so encouraged her to get back on track with that and set some exercise goals she is still working out 2 days a week which is great.      Other Visit Diagnoses       Acute non-recurrent sinusitis, unspecified location    -  Primary   Relevant Medications   azithromycin  (ZITHROMAX ) 250 MG tablet       No follow-ups on file.    Dorothyann Byars, MD

## 2023-03-10 NOTE — Assessment & Plan Note (Signed)
 She has used my fitness pal before and was able to lose some weight with it so encouraged her to get back on track with that and set some exercise goals she is still working out 2 days a week which is great.

## 2024-03-14 ENCOUNTER — Encounter: Payer: Self-pay | Admitting: Family Medicine
